# Patient Record
Sex: Male | Born: 1960
Health system: Southern US, Community
[De-identification: ages and names within clinical notes are randomized; demographics above are authoritative.]

## PROBLEM LIST (undated history)

## (undated) DIAGNOSIS — M1990 Unspecified osteoarthritis, unspecified site: Secondary | ICD-10-CM

## (undated) DIAGNOSIS — E785 Hyperlipidemia, unspecified: Secondary | ICD-10-CM

## (undated) DIAGNOSIS — I2510 Atherosclerotic heart disease of native coronary artery without angina pectoris: Secondary | ICD-10-CM

## (undated) DIAGNOSIS — T7840XA Allergy, unspecified, initial encounter: Secondary | ICD-10-CM

## (undated) HISTORY — DX: Allergy, unspecified, initial encounter: T78.40XA

## (undated) HISTORY — DX: Atherosclerotic heart disease of native coronary artery without angina pectoris: I25.10

## (undated) HISTORY — DX: Hyperlipidemia, unspecified: E78.5

## (undated) HISTORY — DX: Unspecified osteoarthritis, unspecified site: M19.90

## (undated) HISTORY — PX: CARDIAC CATHETERIZATION: SHX172

---

## 2008-12-13 HISTORY — PX: NECK SURGERY: SHX720

## 2014-05-14 ENCOUNTER — Encounter: Payer: Self-pay | Admitting: Internal Medicine

## 2014-05-15 ENCOUNTER — Encounter: Payer: Self-pay | Admitting: Internal Medicine

## 2014-07-16 ENCOUNTER — Ambulatory Visit (AMBULATORY_SURGERY_CENTER): Payer: Managed Care, Other (non HMO) | Admitting: *Deleted

## 2014-07-16 VITALS — Ht 71.0 in | Wt 234.2 lb

## 2014-07-16 DIAGNOSIS — Z1211 Encounter for screening for malignant neoplasm of colon: Secondary | ICD-10-CM

## 2014-07-16 MED ORDER — NA SULFATE-K SULFATE-MG SULF 17.5-3.13-1.6 GM/177ML PO SOLN
ORAL | Status: DC
Start: 2014-07-16 — End: 2014-07-30

## 2014-07-16 NOTE — Progress Notes (Signed)
No egg or soy allergy  Pt has had neck surgery but states he has no difficulty moving neck  No problems with anesthesia  No diet medications taken  Pt takes Hydrocodone BID; he states he has no difficulty moving his bowels and has a BM daily

## 2014-07-25 ENCOUNTER — Encounter: Payer: Self-pay | Admitting: Internal Medicine

## 2014-07-30 ENCOUNTER — Ambulatory Visit (AMBULATORY_SURGERY_CENTER): Payer: Managed Care, Other (non HMO) | Admitting: Internal Medicine

## 2014-07-30 ENCOUNTER — Encounter: Payer: Self-pay | Admitting: Internal Medicine

## 2014-07-30 VITALS — BP 129/66 | HR 48 | Temp 97.6°F | Resp 13 | Ht 71.0 in | Wt 234.0 lb

## 2014-07-30 DIAGNOSIS — Z1211 Encounter for screening for malignant neoplasm of colon: Secondary | ICD-10-CM

## 2014-07-30 MED ORDER — SODIUM CHLORIDE 0.9 % IV SOLN: 500.0000 mL | INTRAVENOUS | Status: DC

## 2014-07-30 NOTE — Patient Instructions (Addendum)
Your colonoscopy was normal.  Next routine colonoscopy in 10 years - 2025  I appreciate the opportunity to care for you. Carl E. Gessner, MD, FACG   YOU HAD AN ENDOSCOPIC PROCEDURE TODAY AT THE [facility]: Refer to the procedure report that was given to you for any specific questions about what was found during the examination.  If the procedure report does not answer your questions, please call your gastroenterologist to clarify.  If you requested that your care partner not be given the details of your procedure findings, then the procedure report has been included in a sealed envelope for you to review at your convenience later.  YOU SHOULD EXPECT: Some feelings of bloating in the abdomen. Passage of more gas than usual.  Walking can help get rid of the air that was put into your GI tract during the procedure and reduce the bloating. If you had a lower endoscopy (such as a colonoscopy or flexible sigmoidoscopy) you may notice spotting of blood in your stool or on the toilet paper. If you underwent a bowel prep for your procedure, then you may not have a normal bowel movement for a few days.  DIET: Your first meal following the procedure should be a light meal and then it is ok to progress to your normal diet.  A half-sandwich or bowl of soup is an example of a good first meal.  Heavy or fried foods are harder to digest and may make you feel nauseous or bloated.  Likewise meals heavy in dairy and vegetables can cause extra gas to form and this can also increase the bloating.  Drink plenty of fluids but you should avoid alcoholic beverages for 24 hours.  ACTIVITY: Your care partner should take you home directly after the procedure.  You should plan to take it easy, moving slowly for the rest of the day.  You can resume normal activity the day after the procedure however you should NOT DRIVE or use heavy machinery for 24 hours (because of the sedation medicines used during the test).     SYMPTOMS TO REPORT IMMEDIATELY: A gastroenterologist can be reached at any hour.  During normal business hours, 8:30 AM to 5:00 PM Monday through Friday, call [phone].  After hours and on weekends, please call the GI answering service at [phone] who will take a message and have the physician on call contact you.   Following lower endoscopy (colonoscopy or flexible sigmoidoscopy):  Excessive amounts of blood in the stool  Significant tenderness or worsening of abdominal pains  Swelling of the abdomen that is new, acute  Fever of 100F or higher  FOLLOW UP: If any biopsies were taken you will be contacted by phone or by letter within the next 1-3 weeks.  Call your gastroenterologist if you have not heard about the biopsies in 3 weeks.  Our staff will call the home number listed on your records the next business day following your procedure to check on you and address any questions or concerns that you may have at that time regarding the information given to you following your procedure. This is a courtesy call and so if there is no answer at the home number and we have not heard from you through the emergency physician on call, we will assume that you have returned to your regular daily activities without incident.  SIGNATURES/CONFIDENTIALITY: You and/or your care partner have signed paperwork which will be entered into your electronic medical record.  These   signatures attest to the fact that that the information above on your After Visit Summary has been reviewed and is understood.  Full responsibility of the confidentiality of this discharge information lies with you and/or your care-partner. 

## 2014-07-30 NOTE — Op Note (Signed)
[facility] [address] [place], [id number]   COLONOSCOPY PROCEDURE REPORT  PATIENT: RegisterDuffy Cameron, James Cameron  MR#: [id number] BIRTHDATE: [birth year] , 53  yrs. old GENDER: Male ENDOSCOPIST: Iva Booparl E Gessner, MD, [facility] PROCEDURE DATE:  07/30/2014 PROCEDURE:   Colonoscopy, screening First Screening Colonoscopy - Avg.  risk and is 50 yrs.  old or older Yes.  Prior Negative Screening - Now for repeat screening. N/A  History of Adenoma - Now for follow-up colonoscopy & has been > or = to 3 yrs.  N/A  Polyps Removed Today? No.  Recommend repeat exam, <10 yrs? No. ASA CLASS:   Class II INDICATIONS:average risk screening and first colonoscopy. MEDICATIONS: These medications were titrated to patient response per physician's verbal order, MAC sedation, administered by CRNA, and propofol (Diprivan) 200mg  IV  DESCRIPTION OF PROCEDURE:   After the risks benefits and alternatives of the procedure were thoroughly explained, informed consent was obtained.  A digital rectal exam revealed no abnormalities of the rectum, A digital rectal exam revealed no prostatic nodules, and A digital rectal exam revealed the prostate was not enlarged.   The LB [id number]  endoscope was introduced through the anus and advanced to the cecum, which was identified by both the appendix and ileocecal valve. No adverse events experienced.   The quality of the prep was excellent using Suprep  The instrument was then slowly withdrawn as the colon was fully examined.      COLON FINDINGS: A normal appearing cecum, ileocecal valve, and appendiceal orifice were identified.  The ascending, hepatic flexure, transverse, splenic flexure, descending, sigmoid colon and rectum appeared unremarkable.  No polyps or cancers were seen. Retroflexed views revealed no abnormalities. The time to cecum=1 minutes 21 seconds.  Withdrawal time=7 minutes 38 seconds.  The scope was withdrawn and the procedure  completed. COMPLICATIONS: There were no complications.  ENDOSCOPIC IMPRESSION: Normal colonoscopy - excellent prep - first screening  RECOMMENDATIONS: Repeat colonoscopy 10 years - 2025   eSigned:  Iva Booparl E Gessner, MD, [facility] 07/30/2014 11:50 AM   cc: The Patient  and Alysia PennaScott Holwerda, MD

## 2014-07-30 NOTE — Progress Notes (Signed)
A/ox3, pleased with MAC, report to RN 

## 2014-07-31 ENCOUNTER — Telehealth: Payer: Self-pay | Admitting: *Deleted

## 2014-07-31 NOTE — Telephone Encounter (Signed)
  Follow up Call-  Call back number 07/30/2014  Post procedure Call Back phone  # [phone]  Permission to leave phone message Yes     Patient questions:  Do you have a fever, pain , or abdominal swelling? No. Pain Score  0 *  Have you tolerated food without any problems? Yes.    Have you been able to return to your normal activities? Yes.    Do you have any questions about your discharge instructions: Diet   No. Medications  No. Follow up visit  No.  Do you have questions or concerns about your Care? No.  Actions: * If pain score is 4 or above: No action needed, pain <4.

## 2019-06-08 ENCOUNTER — Other Ambulatory Visit: Payer: Self-pay | Admitting: Internal Medicine

## 2019-06-08 DIAGNOSIS — E785 Hyperlipidemia, unspecified: Secondary | ICD-10-CM

## 2019-07-10 ENCOUNTER — Other Ambulatory Visit: Payer: Self-pay

## 2019-07-10 ENCOUNTER — Ambulatory Visit
Admission: RE | Admit: 2019-07-10 | Discharge: 2019-07-10 | Disposition: A | Discharge status: Home / Self Care | Payer: Self-pay | Source: Ambulatory Visit | Attending: Internal Medicine | Admitting: Internal Medicine

## 2019-07-10 DIAGNOSIS — E785 Hyperlipidemia, unspecified: Secondary | ICD-10-CM

## 2020-03-26 ENCOUNTER — Encounter (HOSPITAL_COMMUNITY): Payer: Self-pay

## 2020-03-26 ENCOUNTER — Other Ambulatory Visit: Payer: Self-pay

## 2020-03-26 ENCOUNTER — Inpatient Hospital Stay (HOSPITAL_COMMUNITY)
Admission: EM | Admit: 2020-03-26 | Discharge: 2020-03-31 | DRG: 246 | Disposition: A | Discharge status: Home / Self Care | Payer: 59 | Attending: Cardiovascular Disease | Admitting: Cardiovascular Disease

## 2020-03-26 ENCOUNTER — Emergency Department (HOSPITAL_COMMUNITY): Payer: 59

## 2020-03-26 DIAGNOSIS — I2119 ST elevation (STEMI) myocardial infarction involving other coronary artery of inferior wall: Principal | ICD-10-CM | POA: Diagnosis present

## 2020-03-26 DIAGNOSIS — D71 Functional disorders of polymorphonuclear neutrophils: Secondary | ICD-10-CM | POA: Diagnosis present

## 2020-03-26 DIAGNOSIS — Z87891 Personal history of nicotine dependence: Secondary | ICD-10-CM

## 2020-03-26 DIAGNOSIS — I200 Unstable angina: Secondary | ICD-10-CM | POA: Diagnosis present

## 2020-03-26 DIAGNOSIS — Z8379 Family history of other diseases of the digestive system: Secondary | ICD-10-CM

## 2020-03-26 DIAGNOSIS — Z955 Presence of coronary angioplasty implant and graft: Secondary | ICD-10-CM

## 2020-03-26 DIAGNOSIS — I10 Essential (primary) hypertension: Secondary | ICD-10-CM | POA: Diagnosis present

## 2020-03-26 DIAGNOSIS — Z8249 Family history of ischemic heart disease and other diseases of the circulatory system: Secondary | ICD-10-CM

## 2020-03-26 DIAGNOSIS — I700 Atherosclerosis of aorta: Secondary | ICD-10-CM | POA: Diagnosis present

## 2020-03-26 DIAGNOSIS — R001 Bradycardia, unspecified: Secondary | ICD-10-CM | POA: Diagnosis present

## 2020-03-26 DIAGNOSIS — I25110 Atherosclerotic heart disease of native coronary artery with unstable angina pectoris: Secondary | ICD-10-CM | POA: Diagnosis present

## 2020-03-26 DIAGNOSIS — M1990 Unspecified osteoarthritis, unspecified site: Secondary | ICD-10-CM | POA: Diagnosis present

## 2020-03-26 DIAGNOSIS — E785 Hyperlipidemia, unspecified: Secondary | ICD-10-CM | POA: Diagnosis present

## 2020-03-26 DIAGNOSIS — I214 Non-ST elevation (NSTEMI) myocardial infarction: Secondary | ICD-10-CM | POA: Diagnosis present

## 2020-03-26 DIAGNOSIS — E669 Obesity, unspecified: Secondary | ICD-10-CM | POA: Diagnosis present

## 2020-03-26 DIAGNOSIS — Z880 Allergy status to penicillin: Secondary | ICD-10-CM

## 2020-03-26 DIAGNOSIS — I472 Ventricular tachycardia: Secondary | ICD-10-CM | POA: Diagnosis not present

## 2020-03-26 DIAGNOSIS — Z6836 Body mass index (BMI) 36.0-36.9, adult: Secondary | ICD-10-CM

## 2020-03-26 DIAGNOSIS — S75001A Unspecified injury of femoral artery, right leg, initial encounter: Secondary | ICD-10-CM | POA: Diagnosis not present

## 2020-03-26 DIAGNOSIS — Z20822 Contact with and (suspected) exposure to covid-19: Secondary | ICD-10-CM | POA: Diagnosis present

## 2020-03-26 LAB — CBC
HCT: 44.1 % (ref 39.0–52.0)
Hemoglobin: 15.2 g/dL (ref 13.0–17.0)
MCH: 31.9 pg (ref 26.0–34.0)
MCHC: 34.5 g/dL (ref 30.0–36.0)
MCV: 92.5 fL (ref 80.0–100.0)
Platelets: 248 10*3/uL (ref 150–400)
RBC: 4.77 MIL/uL (ref 4.22–5.81)
RDW: 12.3 % (ref 11.5–15.5)
WBC: 9.7 10*3/uL (ref 4.0–10.5)
nRBC: 0 % (ref 0.0–0.2)

## 2020-03-26 LAB — COMPREHENSIVE METABOLIC PANEL
ALT: 22 U/L (ref 0–44)
AST: 28 U/L (ref 15–41)
Albumin: 4.1 g/dL (ref 3.5–5.0)
Alkaline Phosphatase: 79 U/L (ref 38–126)
Anion gap: 10 (ref 5–15)
BUN: 12 mg/dL (ref 6–20)
CO2: 27 mmol/L (ref 22–32)
Calcium: 9.4 mg/dL (ref 8.9–10.3)
Chloride: 102 mmol/L (ref 98–111)
Creatinine, Ser: 1.21 mg/dL (ref 0.61–1.24)
GFR calc Af Amer: >60 mL/min (ref >60)
GFR calc non Af Amer: >60 mL/min (ref >60)
Glucose, Bld: 104 mg/dL — ABNORMAL HIGH (ref 70–99)
Potassium: 3.7 mmol/L (ref 3.5–5.1)
Sodium: 139 mmol/L (ref 135–145)
Total Bilirubin: 0.9 mg/dL (ref 0.3–1.2)
Total Protein: 7.8 g/dL (ref 6.5–8.1)

## 2020-03-26 LAB — TROPONIN I (HIGH SENSITIVITY): Troponin I (High Sensitivity): 507 ng/L (ref <18)

## 2020-03-26 MED ORDER — ASPIRIN 81 MG PO CHEW
324.0000 mg | CHEWABLE_TABLET | Freq: Once | ORAL | Status: AC
  Administered 2020-03-26: 324 mg via ORAL
  Filled 2020-03-26: qty 4

## 2020-03-26 NOTE — ED Triage Notes (Signed)
Pt states that he has had [facility] for a while. Pt states that it got so bad he almost called EMS last night. Pt denies any leg swelling. No hx of COPD or asthma. Pt quit smoking 17 years ago.

## 2020-03-26 NOTE — ED Provider Notes (Signed)
[facility] Emergency Department Provider Note MRN:  [id number]  Arrival date & time: 03/27/20     Chief Complaint   Shortness of Breath   History of Present Illness   James Cameron is a 59 y.o. year-old male with a history of CAD presenting to the ED with chief complaint of shortness of breath.  Patient is endorsing dyspnea on exertion and chest pressure for the past 2 days.  Dr. Meredith Pel feels seems that whenever he tries to do anything active, even minimal ambulating he becomes very short of breath and experiences a chest pressure, occasionally with diaphoresis and nausea.  Feels generally fatigued and weak when standing or laying still, denies having any chest pain or shortness of breath when not exerting himself.  Denies any fever or cough, no abdominal pain, no numbness or weakness, no other complaints.  Review of Systems  A complete 10 system review of systems was obtained and all systems are negative except as noted in the HPI and PMH.   Patient's Health History    Past Medical History:  Diagnosis Date  . Allergy    hx sinus infections  . Arthritis     Past Surgical History:  Procedure Laterality Date  . NECK SURGERY  2010   3 plates, 12 screws    Family History  Problem Relation Age of Onset  . Crohn's disease Mother     Social History   Socioeconomic History  . Marital status: Married    Spouse name: Not on file  . Number of children: Not on file  . Years of education: Not on file  . Highest education level: Not on file  Occupational History  . Not on file  Tobacco Use  . Smoking status: Former Research scientist (life sciences)  . Smokeless tobacco: Never Used  Substance and Sexual Activity  . Alcohol use: Yes    Comment: very rarely  . Drug use: No  . Sexual activity: Not on file  Other Topics Concern  . Not on file  Social History Narrative  . Not on file   Social Determinants of Health   Financial Resource Strain:   . Difficulty of Paying Living  Expenses:   Food Insecurity:   . Worried About Charity fundraiser in the Last Year:   . Arboriculturist in the Last Year:   Transportation Needs:   . Film/video editor (Medical):   Marland Kitchen Lack of Transportation (Non-Medical):   Physical Activity:   . Days of Exercise per Week:   . Minutes of Exercise per Session:   Stress:   . Feeling of Stress :   Social Connections:   . Frequency of Communication with Friends and Family:   . Frequency of Social Gatherings with Friends and Family:   . Attends Religious Services:   . Active Member of Clubs or Organizations:   . Attends Archivist Meetings:   Marland Kitchen Marital Status:   Intimate Partner Violence:   . Fear of Current or Ex-Partner:   . Emotionally Abused:   Marland Kitchen Physically Abused:   . Sexually Abused:      Physical Exam   Vitals:   03/26/20 2218 03/27/20 0000  BP: (!) 148/99 131/78  Pulse: 65 (!) 57  Resp: 17 10  Temp:    SpO2: 100% 97%    CONSTITUTIONAL: Well-appearing, NAD NEURO:  Alert and oriented x 3, no focal deficits EYES:  eyes equal and reactive ENT/NECK:  no LAD, no JVD CARDIO: Regular  rate, well-perfused, normal S1 and S2 PULM:  CTAB no wheezing or rhonchi GI/GU:  normal bowel sounds, non-distended, non-tender MSK/SPINE:  No gross deformities, no edema SKIN:  no rash, atraumatic PSYCH:  Appropriate speech and behavior  *Additional and/or pertinent findings included in MDM below  Diagnostic and Interventional Summary    EKG Interpretation  Date/Time:  Wednesday March 26 2020 16:11:05 EDT Ventricular Rate:  69 PR Interval:  138 QRS Duration: 88 QT Interval:  400 QTC Calculation: 428 R Axis:   73 Text Interpretation: Normal sinus rhythm Normal ECG No previous ECGs available Confirmed by Kennis Carina [phone]) on 03/26/2020 10:48:08 PM      Labs Reviewed  COMPREHENSIVE METABOLIC PANEL - Abnormal; Notable for the following components:      Result Value   Glucose, Bld 104 (*)    All other  components within normal limits  TROPONIN I (HIGH SENSITIVITY) - Abnormal; Notable for the following components:   Troponin I (High Sensitivity) 507 (*)    All other components within normal limits  RESPIRATORY PANEL BY RT PCR (FLU A&B, COVID)  CBC  BRAIN NATRIURETIC PEPTIDE  APTT  PROTIME-INR  TROPONIN I (HIGH SENSITIVITY)    DG Chest 2 View  Final Result      Medications  oxyCODONE (Oxy IR/ROXICODONE) immediate release tablet 5 mg (has no administration in time range)  nitroGLYCERIN (NITROSTAT) SL tablet 0.4 mg (has no administration in time range)  aspirin chewable tablet 324 mg (324 mg Oral Given 03/26/20 2301)     Procedures  /  Critical Care .Critical Care Performed by: Sabas Sous, MD Authorized by: Sabas Sous, MD   Critical care provider statement:    Critical care time (minutes):  45   Critical care was necessary to treat or prevent imminent or life-threatening deterioration of the following conditions: Acute coronary syndrome, non-ST segment elevation myocardial infarction.   Critical care was time spent personally by me on the following activities:  Discussions with consultants, evaluation of patient's response to treatment, examination of patient, ordering and performing treatments and interventions, ordering and review of laboratory studies, ordering and review of radiographic studies, pulse oximetry, re-evaluation of patient's condition, obtaining history from patient or surrogate and review of old charts    ED Course and Medical Decision Making  I have reviewed the triage vital signs, the nursing notes, and pertinent available records from the EMR.  Listed above are laboratory and imaging tests that I personally ordered, reviewed, and interpreted and then considered in my medical decision making (see below for details).      Highly consistent with angina and cardiac chest pain, however currently not providing any signs or symptoms of unstable angina.   EKG is without ischemic findings, awaiting troponin, chest x-ray.  No evidence of DVT, doubt PE.  Strong family history of CAD, also has a coronary CT demonstrating evidence of CAD few years ago.  With 2 negative troponins and continued lack of any pain at rest, I would consider very close cardiology follow-up as an outpatient however I would also offer admission given my concern for cardiac chest pain.  12:20 AM update: First troponin returns at greater than 500.  Patient continues to look well, he is now admitting to having intermittent chest pressure while at rest.  This raises concern for ACS, namely NSTEMI.  Discussed case with Dr. Electa Sniff of cardiology, who recommends heparin drip and admission to the cardiology service at [facility].  Elmer Sow. Pilar Plate, MD [facility] [email or web address]  Final Clinical Impressions(s) / ED Diagnoses     ICD-10-CM   1. NSTEMI (non-ST elevated myocardial infarction) [facility])  I21.4     ED Discharge Orders    None       Discharge Instructions Discussed with and Provided to Patient:   Discharge Instructions   None       Sabas Sous, MD 03/27/20 0025

## 2020-03-27 ENCOUNTER — Inpatient Hospital Stay (HOSPITAL_COMMUNITY)
Admission: EM | Disposition: A | Discharge status: Home / Self Care | Payer: Self-pay | Source: Home / Self Care | Attending: Cardiovascular Disease

## 2020-03-27 ENCOUNTER — Other Ambulatory Visit (HOSPITAL_COMMUNITY): Payer: 59

## 2020-03-27 ENCOUNTER — Encounter (HOSPITAL_COMMUNITY): Payer: Self-pay | Admitting: Cardiovascular Disease

## 2020-03-27 ENCOUNTER — Other Ambulatory Visit: Payer: Self-pay

## 2020-03-27 DIAGNOSIS — I361 Nonrheumatic tricuspid (valve) insufficiency: Secondary | ICD-10-CM | POA: Diagnosis not present

## 2020-03-27 DIAGNOSIS — I2119 ST elevation (STEMI) myocardial infarction involving other coronary artery of inferior wall: Secondary | ICD-10-CM | POA: Diagnosis present

## 2020-03-27 DIAGNOSIS — I724 Aneurysm of artery of lower extremity: Secondary | ICD-10-CM | POA: Diagnosis not present

## 2020-03-27 DIAGNOSIS — R001 Bradycardia, unspecified: Secondary | ICD-10-CM | POA: Diagnosis present

## 2020-03-27 DIAGNOSIS — I472 Ventricular tachycardia: Secondary | ICD-10-CM | POA: Diagnosis not present

## 2020-03-27 DIAGNOSIS — Z8379 Family history of other diseases of the digestive system: Secondary | ICD-10-CM | POA: Diagnosis not present

## 2020-03-27 DIAGNOSIS — I25110 Atherosclerotic heart disease of native coronary artery with unstable angina pectoris: Secondary | ICD-10-CM | POA: Diagnosis present

## 2020-03-27 DIAGNOSIS — Z20822 Contact with and (suspected) exposure to covid-19: Secondary | ICD-10-CM | POA: Diagnosis present

## 2020-03-27 DIAGNOSIS — Z880 Allergy status to penicillin: Secondary | ICD-10-CM | POA: Diagnosis not present

## 2020-03-27 DIAGNOSIS — I2121 ST elevation (STEMI) myocardial infarction involving left circumflex coronary artery: Secondary | ICD-10-CM

## 2020-03-27 DIAGNOSIS — I2510 Atherosclerotic heart disease of native coronary artery without angina pectoris: Secondary | ICD-10-CM | POA: Diagnosis not present

## 2020-03-27 DIAGNOSIS — M1990 Unspecified osteoarthritis, unspecified site: Secondary | ICD-10-CM | POA: Diagnosis present

## 2020-03-27 DIAGNOSIS — I10 Essential (primary) hypertension: Secondary | ICD-10-CM | POA: Diagnosis present

## 2020-03-27 DIAGNOSIS — Z8249 Family history of ischemic heart disease and other diseases of the circulatory system: Secondary | ICD-10-CM | POA: Diagnosis not present

## 2020-03-27 DIAGNOSIS — I214 Non-ST elevation (NSTEMI) myocardial infarction: Secondary | ICD-10-CM | POA: Diagnosis present

## 2020-03-27 DIAGNOSIS — I700 Atherosclerosis of aorta: Secondary | ICD-10-CM | POA: Diagnosis present

## 2020-03-27 DIAGNOSIS — D71 Functional disorders of polymorphonuclear neutrophils: Secondary | ICD-10-CM | POA: Diagnosis present

## 2020-03-27 DIAGNOSIS — Z6836 Body mass index (BMI) 36.0-36.9, adult: Secondary | ICD-10-CM | POA: Diagnosis not present

## 2020-03-27 DIAGNOSIS — I200 Unstable angina: Secondary | ICD-10-CM | POA: Diagnosis present

## 2020-03-27 DIAGNOSIS — E785 Hyperlipidemia, unspecified: Secondary | ICD-10-CM | POA: Diagnosis present

## 2020-03-27 DIAGNOSIS — S75001A Unspecified injury of femoral artery, right leg, initial encounter: Secondary | ICD-10-CM | POA: Diagnosis not present

## 2020-03-27 DIAGNOSIS — Z87891 Personal history of nicotine dependence: Secondary | ICD-10-CM | POA: Diagnosis not present

## 2020-03-27 DIAGNOSIS — E669 Obesity, unspecified: Secondary | ICD-10-CM | POA: Diagnosis present

## 2020-03-27 HISTORY — PX: LEFT HEART CATH AND CORONARY ANGIOGRAPHY: CATH118249

## 2020-03-27 HISTORY — PX: CORONARY/GRAFT ACUTE MI REVASCULARIZATION: CATH118305

## 2020-03-27 LAB — CBC
HCT: 42.3 % (ref 39.0–52.0)
HCT: 44.9 % (ref 39.0–52.0)
Hemoglobin: 14.8 g/dL (ref 13.0–17.0)
Hemoglobin: 16.3 g/dL (ref 13.0–17.0)
MCH: 32.5 pg (ref 26.0–34.0)
MCH: 32.1 pg (ref 26.0–34.0)
MCHC: 35 g/dL (ref 30.0–36.0)
MCHC: 36.3 g/dL — ABNORMAL HIGH (ref 30.0–36.0)
MCV: 92.8 fL (ref 80.0–100.0)
MCV: 88.6 fL (ref 80.0–100.0)
Platelets: 233 10*3/uL (ref 150–400)
Platelets: 292 10*3/uL (ref 150–400)
RBC: 4.56 MIL/uL (ref 4.22–5.81)
RBC: 5.07 MIL/uL (ref 4.22–5.81)
RDW: 12.4 % (ref 11.5–15.5)
RDW: 12.1 % (ref 11.5–15.5)
WBC: 7.8 10*3/uL (ref 4.0–10.5)
WBC: 14.9 10*3/uL — ABNORMAL HIGH (ref 4.0–10.5)
nRBC: 0 % (ref 0.0–0.2)
nRBC: 0 % (ref 0.0–0.2)

## 2020-03-27 LAB — LIPID PANEL
Cholesterol: 137 mg/dL (ref 0–200)
HDL: 31 mg/dL — ABNORMAL LOW (ref >40)
LDL Cholesterol: 83 mg/dL (ref 0–99)
Total CHOL/HDL Ratio: 4.4 RATIO
Triglycerides: 113 mg/dL (ref <150)
VLDL: 23 mg/dL (ref 0–40)

## 2020-03-27 LAB — POCT ACTIVATED CLOTTING TIME
Activated Clotting Time: 307 seconds
Activated Clotting Time: 411 seconds
Activated Clotting Time: 169 seconds

## 2020-03-27 LAB — MRSA PCR SCREENING: MRSA by PCR: NEGATIVE

## 2020-03-27 LAB — HIV ANTIBODY (ROUTINE TESTING W REFLEX): HIV Screen 4th Generation wRfx: NONREACTIVE

## 2020-03-27 LAB — CREATININE, SERUM
Creatinine, Ser: 1.27 mg/dL — ABNORMAL HIGH (ref 0.61–1.24)
GFR calc Af Amer: >60 mL/min (ref >60)
GFR calc non Af Amer: >60 mL/min (ref >60)

## 2020-03-27 LAB — RESPIRATORY PANEL BY RT PCR (FLU A&B, COVID)
Influenza A by PCR: NEGATIVE
Influenza B by PCR: NEGATIVE
SARS Coronavirus 2 by RT PCR: NEGATIVE

## 2020-03-27 LAB — APTT: aPTT: 32 seconds (ref 24–36)

## 2020-03-27 LAB — HEPARIN LEVEL (UNFRACTIONATED): Heparin Unfractionated: 0.3 IU/mL (ref 0.30–0.70)

## 2020-03-27 LAB — HEMOGLOBIN A1C
Hgb A1c MFr Bld: 5.9 % — ABNORMAL HIGH (ref 4.8–5.6)
Mean Plasma Glucose: 122.63 mg/dL

## 2020-03-27 LAB — PROTIME-INR
INR: 1 (ref 0.8–1.2)
Prothrombin Time: 13.3 seconds (ref 11.4–15.2)

## 2020-03-27 LAB — BRAIN NATRIURETIC PEPTIDE: B Natriuretic Peptide: 42 pg/mL (ref 0.0–100.0)

## 2020-03-27 LAB — TROPONIN I (HIGH SENSITIVITY): Troponin I (High Sensitivity): 513 ng/L (ref <18)

## 2020-03-27 SURGERY — LEFT HEART CATH AND CORONARY ANGIOGRAPHY
Anesthesia: LOCAL

## 2020-03-27 MED ORDER — TICAGRELOR 90 MG PO TABS
ORAL_TABLET | ORAL | Status: AC
  Filled 2020-03-27: qty 2

## 2020-03-27 MED ORDER — NITROGLYCERIN 1 MG/10 ML FOR IR/CATH LAB
INTRA_ARTERIAL | Status: AC
  Filled 2020-03-27: qty 10

## 2020-03-27 MED ORDER — IOHEXOL 350 MG/ML SOLN
INTRAVENOUS | Status: AC
  Filled 2020-03-27: qty 1

## 2020-03-27 MED ORDER — LABETALOL HCL 5 MG/ML IV SOLN
INTRAVENOUS | Status: AC
  Filled 2020-03-27: qty 4

## 2020-03-27 MED ORDER — SODIUM CHLORIDE 0.9 % IV SOLN
4.0000 ug/kg/min | INTRAVENOUS | Status: AC
  Filled 2020-03-27: qty 50

## 2020-03-27 MED ORDER — HEPARIN SODIUM (PORCINE) 1000 UNIT/ML IJ SOLN
INTRAMUSCULAR | Status: DC | PRN
  Administered 2020-03-27: 10000 [IU] via INTRAVENOUS

## 2020-03-27 MED ORDER — OXYCODONE HCL 5 MG PO TABS
5.0000 mg | ORAL_TABLET | Freq: Once | ORAL | Status: AC
  Administered 2020-03-27: 5 mg via ORAL
  Filled 2020-03-27: qty 1

## 2020-03-27 MED ORDER — FENTANYL CITRATE (PF) 100 MCG/2ML IJ SOLN
INTRAMUSCULAR | Status: DC | PRN
  Administered 2020-03-27 (×3): 25 ug via INTRAVENOUS

## 2020-03-27 MED ORDER — MIDAZOLAM HCL 2 MG/2ML IJ SOLN
INTRAMUSCULAR | Status: DC | PRN
  Administered 2020-03-27 (×2): 2 mg via INTRAVENOUS

## 2020-03-27 MED ORDER — FUROSEMIDE 10 MG/ML IJ SOLN
INTRAMUSCULAR | Status: DC | PRN
  Administered 2020-03-27: 40 mg via INTRAVENOUS

## 2020-03-27 MED ORDER — LABETALOL HCL 5 MG/ML IV SOLN: 10.0000 mg | INTRAVENOUS | Status: AC | PRN

## 2020-03-27 MED ORDER — ROSUVASTATIN CALCIUM 20 MG PO TABS
40.0000 mg | ORAL_TABLET | Freq: Every day | ORAL | Status: DC
  Administered 2020-03-27 – 2020-03-31 (×5): 40 mg via ORAL
  Filled 2020-03-27 (×5): qty 2

## 2020-03-27 MED ORDER — ENOXAPARIN SODIUM 40 MG/0.4ML ~~LOC~~ SOLN
40.0000 mg | SUBCUTANEOUS | Status: DC
  Administered 2020-03-28 – 2020-03-31 (×4): 40 mg via SUBCUTANEOUS
  Filled 2020-03-27 (×4): qty 0.4

## 2020-03-27 MED ORDER — CANGRELOR BOLUS VIA INFUSION
INTRAVENOUS | Status: DC | PRN
  Administered 2020-03-27: 3606 ug via INTRAVENOUS

## 2020-03-27 MED ORDER — MIDAZOLAM HCL 2 MG/2ML IJ SOLN
INTRAMUSCULAR | Status: AC
  Filled 2020-03-27: qty 2

## 2020-03-27 MED ORDER — FENTANYL CITRATE (PF) 100 MCG/2ML IJ SOLN
INTRAMUSCULAR | Status: AC
  Filled 2020-03-27: qty 2

## 2020-03-27 MED ORDER — MORPHINE SULFATE (PF) 2 MG/ML IV SOLN
2.0000 mg | INTRAVENOUS | Status: DC | PRN
  Administered 2020-03-27 – 2020-03-28 (×4): 2 mg via INTRAVENOUS
  Filled 2020-03-27 (×4): qty 1

## 2020-03-27 MED ORDER — SODIUM CHLORIDE 0.9 % IV SOLN: INTRAVENOUS | Status: DC

## 2020-03-27 MED ORDER — TICAGRELOR 90 MG PO TABS
ORAL_TABLET | ORAL | Status: DC | PRN
  Administered 2020-03-27: 180 mg via ORAL

## 2020-03-27 MED ORDER — SODIUM CHLORIDE 0.9 % IV SOLN: 250.0000 mL | INTRAVENOUS | Status: DC | PRN

## 2020-03-27 MED ORDER — SODIUM CHLORIDE 0.9% FLUSH: 3.0000 mL | INTRAVENOUS | Status: DC | PRN

## 2020-03-27 MED ORDER — SODIUM CHLORIDE 0.9 % IV SOLN: INTRAVENOUS | Status: AC

## 2020-03-27 MED ORDER — FUROSEMIDE 10 MG/ML IJ SOLN
INTRAMUSCULAR | Status: AC
  Filled 2020-03-27: qty 4

## 2020-03-27 MED ORDER — ASPIRIN EC 81 MG PO TBEC
81.0000 mg | DELAYED_RELEASE_TABLET | Freq: Every day | ORAL | Status: DC
  Administered 2020-03-28 – 2020-03-31 (×4): 81 mg via ORAL
  Filled 2020-03-27 (×4): qty 1

## 2020-03-27 MED ORDER — LABETALOL HCL 5 MG/ML IV SOLN
INTRAVENOUS | Status: DC | PRN
  Administered 2020-03-27: 10 mg via INTRAVENOUS

## 2020-03-27 MED ORDER — NITROGLYCERIN 0.4 MG/SPRAY TL SOLN
Status: AC
  Filled 2020-03-27: qty 4.9

## 2020-03-27 MED ORDER — HEPARIN (PORCINE) IN NACL 1000-0.9 UT/500ML-% IV SOLN
INTRAVENOUS | Status: DC | PRN
  Administered 2020-03-27 (×2): 500 mL

## 2020-03-27 MED ORDER — NITROGLYCERIN 0.4 MG SL SUBL
0.4000 mg | SUBLINGUAL_TABLET | SUBLINGUAL | Status: DC | PRN
  Administered 2020-03-27 (×4): 0.4 mg via SUBLINGUAL
  Filled 2020-03-27 (×2): qty 1

## 2020-03-27 MED ORDER — ASPIRIN 81 MG PO CHEW: 81.0000 mg | CHEWABLE_TABLET | ORAL | Status: DC

## 2020-03-27 MED ORDER — HEPARIN (PORCINE) 25000 UT/250ML-% IV SOLN
1300.0000 [IU]/h | INTRAVENOUS | Status: DC
  Administered 2020-03-27: 1200 [IU]/h via INTRAVENOUS
  Filled 2020-03-27: qty 250

## 2020-03-27 MED ORDER — NITROGLYCERIN 1 MG/10 ML FOR IR/CATH LAB
INTRA_ARTERIAL | Status: DC | PRN
  Administered 2020-03-27: 200 ug

## 2020-03-27 MED ORDER — SODIUM CHLORIDE 0.9 % IV SOLN
INTRAVENOUS | Status: AC | PRN
  Administered 2020-03-27: 4 ug/kg/min via INTRAVENOUS

## 2020-03-27 MED ORDER — METOPROLOL TARTRATE 12.5 MG HALF TABLET
12.5000 mg | ORAL_TABLET | Freq: Two times a day (BID) | ORAL | Status: DC
  Administered 2020-03-27 – 2020-03-31 (×9): 12.5 mg via ORAL
  Filled 2020-03-27 (×9): qty 1

## 2020-03-27 MED ORDER — ONDANSETRON HCL 4 MG/2ML IJ SOLN: 4.0000 mg | Freq: Four times a day (QID) | INTRAMUSCULAR | Status: DC | PRN

## 2020-03-27 MED ORDER — TICAGRELOR 90 MG PO TABS
90.0000 mg | ORAL_TABLET | Freq: Two times a day (BID) | ORAL | Status: DC
  Administered 2020-03-27 – 2020-03-31 (×8): 90 mg via ORAL
  Filled 2020-03-27 (×8): qty 1

## 2020-03-27 MED ORDER — ATROPINE SULFATE 1 MG/10ML IJ SOSY
PREFILLED_SYRINGE | INTRAMUSCULAR | Status: AC
  Filled 2020-03-27: qty 10

## 2020-03-27 MED ORDER — HYDRALAZINE HCL 20 MG/ML IJ SOLN
10.0000 mg | INTRAMUSCULAR | Status: AC | PRN
  Administered 2020-03-27 (×2): 10 mg via INTRAVENOUS
  Filled 2020-03-27: qty 1

## 2020-03-27 MED ORDER — HEPARIN BOLUS VIA INFUSION
4000.0000 [IU] | Freq: Once | INTRAVENOUS | Status: AC
  Administered 2020-03-27: 4000 [IU] via INTRAVENOUS
  Filled 2020-03-27: qty 4000

## 2020-03-27 MED ORDER — CHLORHEXIDINE GLUCONATE CLOTH 2 % EX PADS
6.0000 | MEDICATED_PAD | Freq: Every day | CUTANEOUS | Status: DC
  Administered 2020-03-27 – 2020-03-31 (×4): 6 via TOPICAL

## 2020-03-27 MED ORDER — LIDOCAINE HCL (PF) 1 % IJ SOLN
INTRAMUSCULAR | Status: DC | PRN
  Administered 2020-03-27: 2 mL

## 2020-03-27 MED ORDER — IOHEXOL 350 MG/ML SOLN
INTRAVENOUS | Status: DC | PRN
  Administered 2020-03-27: 190 mL

## 2020-03-27 MED ORDER — NITROGLYCERIN 0.4 MG SL SUBL: 0.4000 mg | SUBLINGUAL_TABLET | SUBLINGUAL | Status: DC | PRN

## 2020-03-27 MED ORDER — LIDOCAINE HCL (PF) 1 % IJ SOLN
INTRAMUSCULAR | Status: AC
  Filled 2020-03-27: qty 30

## 2020-03-27 MED ORDER — ACETAMINOPHEN 325 MG PO TABS
650.0000 mg | ORAL_TABLET | ORAL | Status: DC | PRN
  Administered 2020-03-27: 650 mg via ORAL
  Filled 2020-03-27: qty 2

## 2020-03-27 MED ORDER — HEPARIN (PORCINE) IN NACL 1000-0.9 UT/500ML-% IV SOLN
INTRAVENOUS | Status: AC
  Filled 2020-03-27: qty 1000

## 2020-03-27 MED ORDER — SODIUM CHLORIDE 0.9% FLUSH
3.0000 mL | Freq: Two times a day (BID) | INTRAVENOUS | Status: DC
  Administered 2020-03-27 – 2020-03-31 (×7): 3 mL via INTRAVENOUS

## 2020-03-27 MED ORDER — HEPARIN SODIUM (PORCINE) 1000 UNIT/ML IJ SOLN
INTRAMUSCULAR | Status: AC
  Filled 2020-03-27: qty 1

## 2020-03-27 MED ORDER — CANGRELOR TETRASODIUM 50 MG IV SOLR
INTRAVENOUS | Status: AC
  Filled 2020-03-27: qty 50

## 2020-03-27 MED ORDER — VERAPAMIL HCL 2.5 MG/ML IV SOLN
INTRAVENOUS | Status: AC
  Filled 2020-03-27: qty 2

## 2020-03-27 SURGICAL SUPPLY — 25 items
BALLN SAPPHIRE 2.5X12 (BALLOONS) ×2
BALLN SAPPHIRE ~~LOC~~ 4.0X18 (BALLOONS) ×1 IMPLANT
BALLOON SAPPHIRE 2.5X12 (BALLOONS) IMPLANT
CATH INFINITI 5FR MULTPACK ANG (CATHETERS) ×1 IMPLANT
CATH OPTITORQUE TIG 4.0 5F (CATHETERS) ×1 IMPLANT
CATH VISTA GUIDE 6FR JR4 (CATHETERS) ×1 IMPLANT
CATH VISTA GUIDE 6FR XB3.5 (CATHETERS) ×1 IMPLANT
ELECT DEFIB PAD ADLT CADENCE (PAD) ×1 IMPLANT
GLIDESHEATH SLEND SS 6F .021 (SHEATH) ×1 IMPLANT
GUIDEWIRE INQWIRE 1.5J.035X260 (WIRE) IMPLANT
INQWIRE 1.5J .035X260CM (WIRE) ×2
KIT ENCORE 26 ADVANTAGE (KITS) ×1 IMPLANT
KIT HEART LEFT (KITS) ×2 IMPLANT
PACK CARDIAC CATHETERIZATION (CUSTOM PROCEDURE TRAY) ×2 IMPLANT
SHEATH PINNACLE 6F 10CM (SHEATH) ×1 IMPLANT
SHEATH PINNACLE 7F 10CM (SHEATH) ×1 IMPLANT
SHEATH PROBE COVER 6X72 (BAG) ×1 IMPLANT
STENT SYNERGY XD 2.50X16 (Permanent Stent) IMPLANT
STENT SYNERGY XD 3.50X24 (Permanent Stent) IMPLANT
SYNERGY XD 2.50X16 (Permanent Stent) ×2 IMPLANT
SYNERGY XD 3.50X24 (Permanent Stent) ×2 IMPLANT
TRANSDUCER W/STOPCOCK (MISCELLANEOUS) ×2 IMPLANT
TUBING CIL FLEX 10 FLL-RA (TUBING) ×2 IMPLANT
WIRE ASAHI PROWATER 180CM (WIRE) ×1 IMPLANT
WIRE EMERALD 3MM-J .035X150CM (WIRE) ×1 IMPLANT

## 2020-03-27 NOTE — Progress Notes (Signed)
R femoral sheath removed by this RN with the assistance of Tiara, RN. Pt educated about procedure, possible complications and after-removal care. Atropine was pulled in the event of a vagal response, but was not used. 10 cc of blood was removed from sheath prior to removal. Tape was removed, sutures were cut, and firm pressure was held over the femoral artery (start time: 2233) for 10 minutes. Some blood oozed out at beginning of procedure, but hemostasis was achieved after 22 minutes of gradually reducing pressure at site (end time: 2255). Small level 1 hematoma noted slightly above sheath exit (marked under dressing) and pressure dressing was applied. Pt had no complications and tolerated removal well. RN will continue to monitor pt and site closely.

## 2020-03-27 NOTE — Progress Notes (Signed)
ANTICOAGULATION CONSULT NOTE - Initial Consult  Pharmacy Consult for IV heparin Indication: chest pain/ACS  Allergies  Allergen Reactions  . Penicillins Rash    Patient Measurements: Weight: 122.5 kg (270 lb) Heparin Dosing Weight: 89 kg  Vital Signs: Temp: 99 F (37.2 C) (04/14 1928) Temp Source: Oral (04/14 1928) BP: 148/99 (04/14 2218) Pulse Rate: 65 (04/14 2218)  Labs: Recent Labs    03/26/20 2250  HGB 15.2  HCT 44.1  PLT 248  CREATININE 1.21  TROPONINIHS 507*    CrCl cannot be calculated (Unknown ideal weight.).   Medical History: Past Medical History:  Diagnosis Date  . Allergy    hx sinus infections  . Arthritis     Medications:  Scheduled:  . oxyCODONE  5 mg Oral Once   Infusions:    Assessment: 42 yoM with SOB now with elevated troponin = 507.  Baseline labs: H/H = 15.2/44.1, Plts = 248, aptt =32, INR = 1  Goal of Therapy:  Heparin level 0.3-0.7 units/ml Monitor platelets by anticoagulation protocol: Yes   Plan:  Baseline aptt/INR and ht STAT Heparin 4000  Unit IV bolus x1 now Start heparin drip at 1200 units/hr Daily CBC/HL Check 1st HL in 6 hours  Lorenza Evangelist 03/27/2020,12:16 AM

## 2020-03-27 NOTE — Progress Notes (Signed)
ANTICOAGULATION CONSULT NOTE  Pharmacy Consult for IV heparin Indication: chest pain/ACS  Allergies  Allergen Reactions  . Penicillins Rash    Patient Measurements: Height: 5\' 11"  (180.3 cm) Weight: 120.2 kg (265 lb) IBW/kg (Calculated) : 75.3 Heparin Dosing Weight: 89 kg  Vital Signs: BP: 137/82 (04/15 0900) Pulse Rate: 59 (04/15 0951)  Labs: Recent Labs    03/26/20 2250 03/27/20 0015 03/27/20 0056 03/27/20 0800  HGB 15.2  --   --  14.8  HCT 44.1  --   --  42.3  PLT 248  --   --  233  APTT  --  32  --   --   LABPROT  --  13.3  --   --   INR  --  1.0  --   --   HEPARINUNFRC  --   --   --  0.30  CREATININE 1.21  --   --   --   TROPONINIHS 507*  --  513*  --     Estimated Creatinine Clearance: 88.9 mL/min (by C-G formula based on SCr of 1.21 mg/dL).   Medical History: Past Medical History:  Diagnosis Date  . Allergy    hx sinus infections  . Arthritis     Medications:  Scheduled:  . rosuvastatin  40 mg Oral Daily   Infusions:  . sodium chloride 50 mL/hr at 03/27/20 0954  . heparin 1,200 Units/hr (03/27/20 0954)    Assessment: 24 yoM with PMH CAD, presenting with DOE and chest pressure x 2 days. Trop found to be elevated although EKG WNL. Pharmacy consulted to dose heparin infusion for possible NSTEMI.   Baseline INR, aPTT: WNL  Prior anticoagulation: none  Significant events:  Today, 03/27/2020:  CBC: stable WNL  Most recent heparin level therapeutic but borderline low on 1200 units/hr  No bleeding or infusion issues per nursing  Planning for [facility] cath  Goal of Therapy: Heparin level 0.3-0.7 units/ml Monitor platelets by anticoagulation protocol: Yes  Plan:  Increase heparin IV infusion to 1300 units/hr; no bolus  Check heparin level 6 hrs after rate change  Daily CBC, daily heparin level once stable  Monitor for signs of bleeding or thrombosis  F/u LHC results and anticoag plans post-cath  [facility], PharmD,  BCPS [phone] 03/27/2020, 10:09 AM

## 2020-03-27 NOTE — H&P (Signed)
Cardiology History and Physical:   Patient ID: [employer] MRN: [id number]; DOB: December 23, 1960  Admit date: 03/26/2020 Date of Consult: 03/27/2020  Primary Care Provider: Velna Hatchet, MD Primary Cardiologist: Evalina Field, MD new Primary Electrophysiologist:  None    Patient Profile:   James Cameron is a 59 y.o. male with a hx of HTN, HLD, obesity, and past smoking who is being seen today for the evaluation of chest pain and DOE at the request of Dr. Charissa Bash.  History of Present Illness:   James Cameron does not currently follow with cardiology. He started noticing DOE last year. PCP obtained a calcium score 06/2019 which was 343 placing him at the 92nd percentile. He was also noted to have aortic atherosclerosis. He states no additional follow up was planned. He has not seen pulmonology for his DOE. He reports intermittent chest discomfort since last year. On Sunday night, he suffered more severe right sided chest pain with radiation to his back and down his right arm. He was able to sit up with some pain relief. Pain felt like something sitting on his chest and was rated as a9/10. Since then, he has continued to have intermittent chest pressure with exertion. He denies chest pain at rest and denies CP since being in the ER. CP has been associated with diaphoresis and SOB. No palpitations, lower extremity edema, orthopnea, or syncope. He is on losartan for HTN and low dose crestor. He is not diabetic. He is obese and does not exercise regularly.   He is married and has been taking care of his ill mother. He has a strong family history of heart disease on his father's side. Father died of MI. His older brother had PCI in his mid-50s. 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 He is a former smoker.    Past Medical History:  Diagnosis Date  . Allergy    hx sinus infections  . Arthritis     Past Surgical History:  Procedure Laterality Date  .  NECK SURGERY  2010   3 plates, 12 screws     Home Medications:  Prior to Admission medications   Medication Sig Start Date End Date Taking? Authorizing Provider  ibuprofen (ADVIL,MOTRIN) 800 MG tablet Take 800 mg by mouth 2 (two) times daily.   Yes [provider]  rosuvastatin (CRESTOR) 5 MG tablet Take 5 mg by mouth at bedtime. 01/04/20  Yes [provider]    Inpatient Medications: Scheduled Meds:  Continuous Infusions: . sodium chloride    . heparin 1,200 Units/hr (03/27/20 0130)   PRN Meds: nitroGLYCERIN  Allergies:    Allergies  Allergen Reactions  . Penicillins Rash    Social History:   Social History   Socioeconomic History  . Marital status: Married    Spouse name: Not on file  . Number of children: Not on file  . Years of education: Not on file  . Highest education level: Not on file  Occupational History  . Not on file  Tobacco Use  . Smoking status: Former Research scientist (life sciences)  . Smokeless tobacco: Never Used  Substance and Sexual Activity  . Alcohol use: Yes    Comment: very rarely  . Drug use: No  . Sexual activity: Not on file  Other Topics Concern  . Not on file  Social History Narrative  . Not on file   Social Determinants of Health   Financial Resource Strain:   . Difficulty  of Paying Living Expenses:   Food Insecurity:   . Worried About Programme researcher, broadcasting/film/video in the Last Year:   . Barista in the Last Year:   Transportation Needs:   . Freight forwarder (Medical):   Marland Kitchen Lack of Transportation (Non-Medical):   Physical Activity:   . Days of Exercise per Week:   . Minutes of Exercise per Session:   Stress:   . Feeling of Stress :   Social Connections:   . Frequency of Communication with Friends and Family:   . Frequency of Social Gatherings with Friends and Family:   . Attends Religious Services:   . Active Member of Clubs or Organizations:   . Attends Banker Meetings:   Marland Kitchen Marital Status:   Intimate  Partner Violence:   . Fear of Current or Ex-Partner:   . Emotionally Abused:   Marland Kitchen Physically Abused:   . Sexually Abused:     Family History:    Family History  Problem Relation Age of Onset  . Crohn's disease Mother   . Heart attack Father   . Heart disease Brother      ROS:  Please see the history of present illness.   All other ROS reviewed and negative.     Physical Exam/Data:   Vitals:   03/27/20 0630 03/27/20 0700 03/27/20 0730 03/27/20 0815  BP: 139/85 (!) 142/101 137/77   Pulse: (!) 53 (!) 53 (!) 51 (!) 54  Resp: 13 13 11 15   Temp:      TempSrc:      SpO2: 98% 98% 97% 96%  Weight:      Height:       No intake or output data in the 24 hours ending 03/27/20 0854 Last 3 Weights 03/27/2020 03/26/2020 07/30/2014  Weight (lbs) 265 lb 270 lb 234 lb  Weight (kg) 120.203 kg 122.471 kg 106.142 kg     Body mass index is 36.96 kg/m.  General:  Obese male in no acute distress HEENT: normal Lymph: no adenopathy Neck: no JVD Endocrine:  No thryomegaly Vascular: No carotid bruits Cardiac:  normal S1, S2; RRR; no murmur  Lungs:  clear to auscultation bilaterally, no wheezing, rhonchi or rales  Abd: soft, nontender, no hepatomegaly  Ext: no edema Musculoskeletal:  No deformities, BUE and BLE strength normal and equal Skin: warm and dry  Neuro:  CNs 2-12 intact, no focal abnormalities noted Psych:  Normal affect   EKG:  The EKG was personally reviewed and demonstrates:  Sinus rhythm, HR 69, TWI III and AVF, no prior for comparison Telemetry:  Telemetry was personally reviewed and demonstrates:  Sinus bradycardia with HR in the 50s  Relevant CV Studies:  Calcium score 07/10/19: IMPRESSION: 1. Patient's total coronary artery calcium score is 343 which is 92nd percentile for patient's of matched age, gender and race/ethnicity. Please note that although the presence of coronary artery calcium documents the presence of coronary artery disease, the severity of this disease  and any potential stenosis cannot be assessed on this noncontrast CT examination. Assessment for potential risk factor modification, dietary therapy or pharmacologic therapy may be warranted, if clinically indicated. 2.  Aortic Atherosclerosis (ICD10-I70.0). 3. Old granulomatous disease, as above.  Laboratory Data:  High Sensitivity Troponin:   Recent Labs  Lab 03/26/20 2250 03/27/20 0056  TROPONINIHS 507* 513*     Chemistry Recent Labs  Lab 03/26/20 2250  NA 139  K 3.7  CL 102  CO2 27  GLUCOSE 104*  BUN 12  CREATININE 1.21  CALCIUM 9.4  GFRNONAA >60  GFRAA >60  ANIONGAP 10    Recent Labs  Lab 03/26/20 2250  PROT 7.8  ALBUMIN 4.1  AST 28  ALT 22  ALKPHOS 79  BILITOT 0.9   Hematology Recent Labs  Lab 03/26/20 2250 03/27/20 0800  WBC 9.7 7.8  RBC 4.77 4.56  HGB 15.2 14.8  HCT 44.1 42.3  MCV 92.5 92.8  MCH 31.9 32.5  MCHC 34.5 35.0  RDW 12.3 12.4  PLT 248 233   BNP Recent Labs  Lab 03/26/20 2252  BNP 42.0    DDimer No results for input(s): DDIMER in the last 168 hours.   Radiology/Studies:  DG Chest 2 View  Result Date: 03/26/2020 CLINICAL DATA:  Shortness of breath. EXAM: CHEST - 2 VIEW COMPARISON:  None. FINDINGS: The heart size and mediastinal contours are within normal limits. Both lungs are clear. No pneumothorax or pleural effusion is noted. The visualized skeletal structures are unremarkable. IMPRESSION: No active cardiopulmonary disease. Electronically Signed   By: Lupita Raider M.D.   On: 03/26/2020 16:44       TIMI Risk Score for Unstable Angina or Non-ST Elevation MI:   The patient's TIMI risk score is 4, which indicates a 20% risk of all cause mortality, new or recurrent myocardial infarction or need for urgent revascularization in the next 14 days.   Assessment and Plan:   Chest pain Aortic atherosclerosis Coronary artery disease  - calcium score of 343 for a 92nd percentile by CT scan 06/2019 - pt presents with symptoms  concerning for stable angina - chest pressure with exertion relieved by rest - hs troponin 507 --> 513 - EKG with TWI III and AVF - given his known atherosclerosis and calcium score, risk factors, and presentation - favor definitive angiography today - continue heparin drip - will start IVF for hydration - received 324 mg ASA last night - will hold off on BB for now given bradycardia in the 50s on telemetry  The patient understands that risks included but are not limited to stroke (1 in 1000), death (1 in 1000), kidney failure [usually temporary] (1 in 500), bleeding (1 in 200), allergic reaction [possibly serious] (1 in 200).  The patient understands the risks of serious complication is 1-2 in 1000 with diagnostic cardiac cath and 1-2% or less with angioplasty/stenting. The patient understands and agrees to proceed.    Hypertension - on 50 mg losartan   Hyperlipidemia - 5 mg crestor - will obtain updated lipid profile - I have increased crestor to 40 mg pending cath   Obesity - will benefit from increased exercise   Former smoker - congratulated him on his continued cessation      For questions or updates, please contact [facility] Please consult [email or web address] for contact info under     Signed, Marcelino Duster, PA  03/27/2020 8:54 AM

## 2020-03-27 NOTE — Progress Notes (Signed)
To lab 3

## 2020-03-27 NOTE — Progress Notes (Signed)
Arrived to cath lab holding from [facility]. Heparin infusing at 1300U/hr to rt ac w/0.9NS at 50cc/hr

## 2020-03-27 NOTE — Plan of Care (Signed)
  Problem: Education: Goal: Knowledge of General Education information will improve Description: Including pain rating scale, medication(s)/side effects and non-pharmacologic comfort measures Outcome: Progressing   Problem: Clinical Measurements: Goal: Cardiovascular complication will be avoided Outcome: Progressing   Problem: Coping: Goal: Level of anxiety will decrease Outcome: Progressing   Problem: Elimination: Goal: Will not experience complications related to urinary retention Outcome: Progressing   Problem: Pain Managment: Goal: General experience of comfort will improve Outcome: Progressing   Problem: Cardiovascular: Goal: Vascular access site(s) Level 0-1 will be maintained Outcome: Progressing

## 2020-03-28 ENCOUNTER — Encounter: Payer: Self-pay | Admitting: Cardiology

## 2020-03-28 ENCOUNTER — Inpatient Hospital Stay (HOSPITAL_COMMUNITY): Payer: 59

## 2020-03-28 DIAGNOSIS — I10 Essential (primary) hypertension: Secondary | ICD-10-CM

## 2020-03-28 DIAGNOSIS — I724 Aneurysm of artery of lower extremity: Secondary | ICD-10-CM | POA: Diagnosis not present

## 2020-03-28 DIAGNOSIS — I361 Nonrheumatic tricuspid (valve) insufficiency: Secondary | ICD-10-CM

## 2020-03-28 DIAGNOSIS — I2119 ST elevation (STEMI) myocardial infarction involving other coronary artery of inferior wall: Secondary | ICD-10-CM | POA: Diagnosis not present

## 2020-03-28 DIAGNOSIS — E669 Obesity, unspecified: Secondary | ICD-10-CM

## 2020-03-28 DIAGNOSIS — E785 Hyperlipidemia, unspecified: Secondary | ICD-10-CM

## 2020-03-28 LAB — BASIC METABOLIC PANEL
Anion gap: 13 (ref 5–15)
BUN: 13 mg/dL (ref 6–20)
CO2: 24 mmol/L (ref 22–32)
Calcium: 9 mg/dL (ref 8.9–10.3)
Chloride: 100 mmol/L (ref 98–111)
Creatinine, Ser: 1.2 mg/dL (ref 0.61–1.24)
GFR calc Af Amer: >60 mL/min (ref >60)
GFR calc non Af Amer: >60 mL/min (ref >60)
Glucose, Bld: 137 mg/dL — ABNORMAL HIGH (ref 70–99)
Potassium: 3.5 mmol/L (ref 3.5–5.1)
Sodium: 137 mmol/L (ref 135–145)

## 2020-03-28 LAB — CBC
HCT: 45.6 % (ref 39.0–52.0)
Hemoglobin: 15.7 g/dL (ref 13.0–17.0)
MCH: 31.2 pg (ref 26.0–34.0)
MCHC: 34.4 g/dL (ref 30.0–36.0)
MCV: 90.5 fL (ref 80.0–100.0)
Platelets: 289 10*3/uL (ref 150–400)
RBC: 5.04 MIL/uL (ref 4.22–5.81)
RDW: 12.2 % (ref 11.5–15.5)
WBC: 17.8 10*3/uL — ABNORMAL HIGH (ref 4.0–10.5)
nRBC: 0 % (ref 0.0–0.2)

## 2020-03-28 LAB — MAGNESIUM: Magnesium: 1.9 mg/dL (ref 1.7–2.4)

## 2020-03-28 LAB — ECHOCARDIOGRAM COMPLETE
Height: 71 in
Weight: 4289.27 oz

## 2020-03-28 MED ORDER — OXYCODONE-ACETAMINOPHEN 5-325 MG PO TABS
1.0000 | ORAL_TABLET | ORAL | Status: DC | PRN
  Administered 2020-03-28 – 2020-03-30 (×5): 1 via ORAL
  Filled 2020-03-28 (×5): qty 1

## 2020-03-28 MED ORDER — LISINOPRIL 5 MG PO TABS
5.0000 mg | ORAL_TABLET | Freq: Every day | ORAL | Status: DC
  Administered 2020-03-28: 5 mg via ORAL
  Filled 2020-03-28: qty 1

## 2020-03-28 MED ORDER — POTASSIUM CHLORIDE CRYS ER 20 MEQ PO TBCR
40.0000 meq | EXTENDED_RELEASE_TABLET | Freq: Once | ORAL | Status: AC
  Administered 2020-03-28: 40 meq via ORAL
  Filled 2020-03-28: qty 2

## 2020-03-28 MED ORDER — PERFLUTREN LIPID MICROSPHERE
1.0000 mL | INTRAVENOUS | Status: AC | PRN
  Administered 2020-03-28: 5 mL via INTRAVENOUS
  Filled 2020-03-28: qty 10

## 2020-03-28 MED FILL — Verapamil HCl IV Soln 2.5 MG/ML: INTRAVENOUS | Qty: 2 | Status: AC

## 2020-03-28 NOTE — Progress Notes (Signed)
Right limited lower extremity arterial duplex completed.  03/28/2020 12:19 PM Eula Fried., MHA, RVT, RDCS, RDMS

## 2020-03-28 NOTE — Progress Notes (Signed)
Patient OOB to go to [facility] and then to sit in chair.  While sitting in chair, patient went into [facility] for approximately 2 screens full 10-20 sec.  Patient states that he felt a little light headed upon happening.  Assisted back to bed and Dr. Flora Lipps paged.

## 2020-03-28 NOTE — Progress Notes (Signed)
Notified by nursing that patient went to the bathroom. Upon returning, he sat in the chair and became lightheaded. Telemetry revealed about 20 sec of Vtach with spontaneous conversion back to sinus rhythm. I presented bedside. Pt denies chest, shoulder, and back pain at this time. Labs reviewed. K 3.5. Will add on a Mg and provide 40 mEq Kdur.

## 2020-03-28 NOTE — Plan of Care (Signed)
  Problem: Education: Goal: Knowledge of General Education information will improve Description: Including pain rating scale, medication(s)/side effects and non-pharmacologic comfort measures Outcome: Progressing   Problem: Clinical Measurements: Goal: Ability to maintain clinical measurements within normal limits will improve Outcome: Progressing Goal: Diagnostic test results will improve Outcome: Progressing Goal: Cardiovascular complication will be avoided Outcome: Progressing   Problem: Nutrition: Goal: Adequate nutrition will be maintained Outcome: Progressing   Problem: Coping: Goal: Level of anxiety will decrease Outcome: Progressing   Problem: Elimination: Goal: Will not experience complications related to urinary retention Outcome: Progressing

## 2020-03-28 NOTE — Progress Notes (Signed)
  Echocardiogram 2D Echocardiogram has been performed with [facility].  Gerda Diss 03/28/2020, 9:00 AM

## 2020-03-28 NOTE — Progress Notes (Signed)
   03/28/20 1554  Clinical Encounter Type  Visited With Patient  Visit Type Initial  Referral From Nurse  Consult/Referral To Chaplain  Spiritual Encounters  Spiritual Needs Literature   Chaplain responded to consult for advanced directive. James Cameron said he did not request an AD, he also did not know what an AD was. Chaplain asked if James Cameron would like Chaplain to leave paperwork for him to read over. James Cameron agreed. Chaplain informed James Cameron to have [employer] if he decides to complete the AD paperwork. Chaplains remain available for support as needs arise.   Chaplain Resident, Amado Coe, M Div [phone] on-call pager

## 2020-03-28 NOTE — Progress Notes (Signed)
I spoke with Dr. Santiago Glad with Cardiology about administration of Metoprolol see MAR. Dr. Santiago Glad said to administer 2200 dose, hold for HR of <60 or SBP <90. Informed of pt BP of 102/44 MAP 59 & HR 64. Will continue to monitor.

## 2020-03-28 NOTE — Progress Notes (Signed)
Cardiology Progress Note  Patient ID: Duel Wentzel MRN: [id number] DOB: 10/13/1961 Date of Encounter: 03/28/2020  Primary Cardiologist: Reatha Harps, MD  Subjective  Admitted as NSTEMI that progressed to STEMI. S/p PCI to RCA/LCX. Residual disease in LAD. Reports R arm soreness.   ROS:  All other ROS reviewed and negative. Pertinent positives noted in the HPI.     Inpatient Medications  Scheduled Meds: . aspirin EC  81 mg Oral Daily  . Chlorhexidine Gluconate Cloth  6 each Topical Daily  . enoxaparin (LOVENOX) injection  40 mg Subcutaneous Q24H  . lisinopril  5 mg Oral Daily  . metoprolol tartrate  12.5 mg Oral BID  . rosuvastatin  40 mg Oral Daily  . sodium chloride flush  3 mL Intravenous Q12H  . ticagrelor  90 mg Oral BID   Continuous Infusions: . sodium chloride 10 mL/hr at 03/27/20 1544  . sodium chloride     PRN Meds: sodium chloride, acetaminophen, morphine injection, nitroGLYCERIN, ondansetron (ZOFRAN) IV, oxyCODONE-acetaminophen, perflutren lipid microspheres (DEFINITY) IV suspension, sodium chloride flush   Vital Signs   Vitals:   03/28/20 0400 03/28/20 0500 03/28/20 0600 03/28/20 0700  BP: 110/84  139/69   Pulse: (!) 109 66 66 (!) 59  Resp: (!) 21 16 13 13   Temp: 98.5 F (36.9 C)     TempSrc: Oral     SpO2: 98% 97% 98% 95%  Weight:  121.6 kg    Height:        Intake/Output Summary (Last 24 hours) at 03/28/2020 0854 Last data filed at 03/28/2020 0400 Gross per 24 hour  Intake 1059.25 ml  Output 2500 ml  Net -1440.75 ml   Last 3 Weights 03/28/2020 03/27/2020 03/26/2020  Weight (lbs) 268 lb 1.3 oz 265 lb 270 lb  Weight (kg) 121.6 kg 120.203 kg 122.471 kg      Telemetry  Overnight telemetry shows SR 60s, which I personally reviewed.   ECG  The most recent ECG shows SR 68 bpm, nonspecific changes inferior leads, which I personally reviewed.   Physical Exam   Vitals:   03/28/20 0400 03/28/20 0500 03/28/20 0600 03/28/20 0700  BP: 110/84  139/69    Pulse: (!) 109 66 66 (!) 59  Resp: (!) 21 16 13 13   Temp: 98.5 F (36.9 C)     TempSrc: Oral     SpO2: 98% 97% 98% 95%  Weight:  121.6 kg    Height:         Intake/Output Summary (Last 24 hours) at 03/28/2020 0854 Last data filed at 03/28/2020 0400 Gross per 24 hour  Intake 1059.25 ml  Output 2500 ml  Net -1440.75 ml    Last 3 Weights 03/28/2020 03/27/2020 03/26/2020  Weight (lbs) 268 lb 1.3 oz 265 lb 270 lb  Weight (kg) 121.6 kg 120.203 kg 122.471 kg    Body mass index is 37.39 kg/m.   General: Well nourished, well developed, in no acute distress Head: Atraumatic, normal size  Eyes: PEERLA, EOMI  Neck: Supple, no JVD Endocrine: No thryomegaly Cardiac: Normal S1, S2; RRR; no murmurs, rubs, or gallops Lungs: Clear to auscultation bilaterally, no wheezing, rhonchi or rales  Abd: Soft, nontender, no hepatomegaly  Ext: No edema, pulses 2+ Musculoskeletal: No deformities, BUE and BLE strength normal and equal Skin: Warm and dry, no rashes   Neuro: Alert and oriented to person, place, time, and situation, CNII-XII grossly intact, no focal deficits  Psych: Normal mood and affect   Labs  High  Sensitivity Troponin:   Recent Labs  Lab 03/26/20 2250 03/27/20 0056  TROPONINIHS 507* 513*     Cardiac EnzymesNo results for input(s): TROPONINI in the last 168 hours. No results for input(s): TROPIPOC in the last 168 hours.  Chemistry Recent Labs  Lab 03/26/20 2250 03/27/20 1800 03/28/20 0550  NA 139  --  137  K 3.7  --  3.5  CL 102  --  100  CO2 27  --  24  GLUCOSE 104*  --  137*  BUN 12  --  13  CREATININE 1.21 1.27* 1.20  CALCIUM 9.4  --  9.0  PROT 7.8  --   --   ALBUMIN 4.1  --   --   AST 28  --   --   ALT 22  --   --   ALKPHOS 79  --   --   BILITOT 0.9  --   --   GFRNONAA >60 >60 >60  GFRAA >60 >60 >60  ANIONGAP 10  --  13    Hematology Recent Labs  Lab 03/27/20 0800 03/27/20 1800 03/28/20 0550  WBC 7.8 14.9* 17.8*  RBC 4.56 5.07 5.04  HGB 14.8 16.3 15.7   HCT 42.3 44.9 45.6  MCV 92.8 88.6 90.5  MCH 32.5 32.1 31.2  MCHC 35.0 36.3* 34.4  RDW 12.4 12.1 12.2  PLT 233 292 289   BNP Recent Labs  Lab 03/26/20 2252  BNP 42.0    DDimer No results for input(s): DDIMER in the last 168 hours.   Radiology  DG Chest 2 View  Result Date: 03/26/2020 CLINICAL DATA:  Shortness of breath. EXAM: CHEST - 2 VIEW COMPARISON:  None. FINDINGS: The heart size and mediastinal contours are within normal limits. Both lungs are clear. No pneumothorax or pleural effusion is noted. The visualized skeletal structures are unremarkable. IMPRESSION: No active cardiopulmonary disease. Electronically Signed   By: Lupita Raider M.D.   On: 03/26/2020 16:44   CARDIAC CATHETERIZATION  Result Date: 03/27/2020  CULPRIT lesion: Prox Cx to Mid Cx lesion is 99% stenosed with 99% stenosed side branch in 2nd Mrg.  A drug-eluting stent was successfully placed using a SYNERGY XD 3.50X24 -> postdilated to 4.1 mm  Post intervention, there is a 0% residual stenosis in the main LCx however the side branch was occluded-100% residual stenosis.  --------  LESION #2: Mid RCA to Dist RCA lesion is 99% stenosed.  A drug-eluting stent was successfully placed using a SYNERGY XD 2.50X16 -postdilated to 2.75 mm  Post intervention, there is a 0% residual stenosis.  --------  Tandem LAD lesions: Prox LAD lesion is 40% stenosed. Mid LAD-1 lesion is 65% stenosed. Mid LAD-2 lesion is 40% stenosed.  -----------------------------------------  There is mild left ventricular systolic dysfunction. The left ventricular ejection fraction is 45-50% by visual estimate.  LV end diastolic pressure is severely elevated prior to PCI, reduced to normal level of post PCI.  SEVERE TWO-VESSEL WITH MODERATE THIRD VESSEL CAD:  Long 95 to 99% proximal to mid LCx (culprit lesion) with TIMI I flow -->  Successful DES PCI using Synergy DES 3.5 mm x 24 mm postdilated to 4.1 mm.  Additional 99% mid-distal RCA focal  lesion (treated due to ongoing pain) ->  DES PCI using Synergy DES 2.5 mm 16 mm postdilated to 2.7 mm.  Tandem 40%, 60% and 40% lesions in the proximal to mid LAD on either side of first diagonal branch.  Plan for now to treat medically  and assess after recovery from MI with two-vessel PCI.  Low normal to mildly reduced LVEF with initially severely elevated LVEDP that reduced after PCI and diuresis from 33 down to 12 mmHg. --> Admitted to CCU for ongoing care.  Manual pressure for sheath removal.   Cardiac Studies  LHC 03/27/2020  CULPRIT lesion: Prox Cx to Mid Cx lesion is 99% stenosed with 99% stenosed side branch in 2nd Mrg.  A drug-eluting stent was successfully placed using a SYNERGY XD 3.50X24 -> postdilated to 4.1 mm  Post intervention, there is a 0% residual stenosis in the main LCx however the side branch was occluded-100% residual stenosis.  --------  LESION #2: Mid RCA to Dist RCA lesion is 99% stenosed.  A drug-eluting stent was successfully placed using a SYNERGY XD 2.50X16 -postdilated to 2.75 mm  Post intervention, there is a 0% residual stenosis.  --------  Tandem LAD lesions: Prox LAD lesion is 40% stenosed. Mid LAD-1 lesion is 65% stenosed. Mid LAD-2 lesion is 40% stenosed.  -----------------------------------------  There is mild left ventricular systolic dysfunction. The left ventricular ejection fraction is 45-50% by visual estimate.  LV end diastolic pressure is severely elevated prior to PCI, reduced to normal level of post PCI.   SEVERE TWO-VESSEL WITH MODERATE THIRD VESSEL CAD:  Long 95 to 99% proximal to mid LCx (culprit lesion) with TIMI I flow --> ? Successful DES PCI using Synergy DES 3.5 mm x 24 mm postdilated to 4.1 mm.  Additional 99% mid-distal RCA focal lesion (treated due to ongoing pain) ->  ? DES PCI using Synergy DES 2.5 mm 16 mm postdilated to 2.7 mm.  Tandem 40%, 60% and 40% lesions in the proximal to mid LAD on either side of first  diagonal branch.  ? Plan for now to treat medically and assess after recovery from MI with two-vessel PCI.  Low normal to mildly reduced LVEF with initially severely elevated LVEDP that reduced after PCI and diuresis from 33 down to 12 mmHg.   --> Admitted to CCU for ongoing care.  Manual pressure for sheath removal.  Patient Profile  59 yo M with history of former tobacco abuse, HTN, HLD, obesity who was admitted 03/27/2020 with 3-4 days of worsening chest pain consistent with NSTEMI 03/27/2020. Progressed to STEMI.   Assessment & Plan   1. STEMI -initially admitted as NSTEMI -CP continued and developed inferior ST elevation prior to cath -s/p PCI  mLCX and mRCA -60% pLAD lesion -continue ASA, brilinta, metoprolol, statin  -elevated LVEDP during cath; add lisinopril 5 mg QD -echo pending  -remain in ICU today. Symptoms started 4-5 days ago. High risk. No signs of mechanical or electrical complications currently.  -EKG this AM looks great and ST resolved  -A1c 5.9 -LDL 83 -I favor medical management of Lad lesion for now  2. HLD -statin  3. HTN -metoprolol and lisinopril as above  FEN -no IVF -dvt ppx: lovenox -diet: heart healthy -code: full   For questions or updates, please contact [place] Please consult [email or web address] for contact info under   Time Spent with Patient: I have spent a total of 25 minutes with patient reviewing hospital notes, telemetry, EKGs, labs and examining the patient as well as establishing an assessment and plan that was discussed with the patient.  > 50% of time was spent in direct patient care.    Signed, Addison Naegeli. Audie Box, [place]  03/28/2020 8:54 AM

## 2020-03-28 NOTE — Progress Notes (Signed)
Micah Flesher paged concerning rhythm of earlier.  Will come and see patient.

## 2020-03-29 DIAGNOSIS — I2119 ST elevation (STEMI) myocardial infarction involving other coronary artery of inferior wall: Secondary | ICD-10-CM | POA: Diagnosis not present

## 2020-03-29 LAB — CBC
HCT: 43.3 % (ref 39.0–52.0)
Hemoglobin: 15.2 g/dL (ref 13.0–17.0)
MCH: 32.1 pg (ref 26.0–34.0)
MCHC: 35.1 g/dL (ref 30.0–36.0)
MCV: 91.4 fL (ref 80.0–100.0)
Platelets: 285 10*3/uL (ref 150–400)
RBC: 4.74 MIL/uL (ref 4.22–5.81)
RDW: 12.2 % (ref 11.5–15.5)
WBC: 16.5 10*3/uL — ABNORMAL HIGH (ref 4.0–10.5)
nRBC: 0 % (ref 0.0–0.2)

## 2020-03-29 LAB — BASIC METABOLIC PANEL
Anion gap: 10 (ref 5–15)
BUN: 13 mg/dL (ref 6–20)
CO2: 25 mmol/L (ref 22–32)
Calcium: 8.9 mg/dL (ref 8.9–10.3)
Chloride: 102 mmol/L (ref 98–111)
Creatinine, Ser: 1.38 mg/dL — ABNORMAL HIGH (ref 0.61–1.24)
GFR calc Af Amer: >60 mL/min (ref >60)
GFR calc non Af Amer: 56 mL/min — ABNORMAL LOW (ref >60)
Glucose, Bld: 146 mg/dL — ABNORMAL HIGH (ref 70–99)
Potassium: 3.9 mmol/L (ref 3.5–5.1)
Sodium: 137 mmol/L (ref 135–145)

## 2020-03-29 LAB — GLUCOSE, CAPILLARY: Glucose-Capillary: 125 mg/dL — ABNORMAL HIGH (ref 70–99)

## 2020-03-29 NOTE — Progress Notes (Signed)
Cardiology Progress Note  Patient ID: James Cameron MRN: [id number] DOB: [birth year] Date of Encounter: 03/29/2020  Primary Cardiologist: Reatha Harps, MD  Subjective  No arrhythmia on tele. Doing well. Had reported NSVT yesterday that was artifact on my review.   ROS:  All other ROS reviewed and negative. Pertinent positives noted in the HPI.     Inpatient Medications  Scheduled Meds:  aspirin EC  81 mg Oral Daily   Chlorhexidine Gluconate Cloth  6 each Topical Daily   enoxaparin (LOVENOX) injection  40 mg Subcutaneous Q24H   metoprolol tartrate  12.5 mg Oral BID   rosuvastatin  40 mg Oral Daily   sodium chloride flush  3 mL Intravenous Q12H   ticagrelor  90 mg Oral BID   Continuous Infusions:  sodium chloride     PRN Meds: sodium chloride, acetaminophen, morphine injection, nitroGLYCERIN, ondansetron (ZOFRAN) IV, oxyCODONE-acetaminophen, sodium chloride flush   Vital Signs   Vitals:   03/29/20 0800 03/29/20 0802 03/29/20 0900 03/29/20 1000  BP: (!) 108/54  108/66 111/66  Pulse:      Resp: 18  (!) 26 14  Temp:  99.6 F (37.6 C)    TempSrc:  Oral    SpO2:  100%    Weight:      Height:        Intake/Output Summary (Last 24 hours) at 03/29/2020 1057 Last data filed at 03/29/2020 0802 Gross per 24 hour  Intake 1080 ml  Output --  Net 1080 ml   Last 3 Weights 03/28/2020 03/27/2020 03/26/2020  Weight (lbs) 268 lb 1.3 oz 265 lb 270 lb  Weight (kg) 121.6 kg 120.203 kg 122.471 kg      Telemetry  Overnight telemetry shows SR 60s, which I personally reviewed.   ECG  The most recent ECG shows SR 68 bpm, nonspecific changes inferior leads, which I personally reviewed.   Physical Exam   Vitals:   03/29/20 0800 03/29/20 0802 03/29/20 0900 03/29/20 1000  BP: (!) 108/54  108/66 111/66  Pulse:      Resp: 18  (!) 26 14  Temp:  99.6 F (37.6 C)    TempSrc:  Oral    SpO2:  100%    Weight:      Height:         Intake/Output Summary (Last 24 hours) at  03/29/2020 1057 Last data filed at 03/29/2020 0802 Gross per 24 hour  Intake 1080 ml  Output --  Net 1080 ml    Last 3 Weights 03/28/2020 03/27/2020 03/26/2020  Weight (lbs) 268 lb 1.3 oz 265 lb 270 lb  Weight (kg) 121.6 kg 120.203 kg 122.471 kg    Body mass index is 37.39 kg/m.   General: Well nourished, well developed, in no acute distress Head: Atraumatic, normal size  Eyes: PEERLA, EOMI  Neck: Supple, no JVD Endocrine: No thryomegaly Cardiac: Normal S1, S2; RRR; no murmurs, rubs, or gallops Lungs: Clear to auscultation bilaterally, no wheezing, rhonchi or rales  Abd: Soft, nontender, no hepatomegaly  Ext: No edema, pulses 2+ Musculoskeletal: No deformities, BUE and BLE strength normal and equal Skin: Warm and dry, no rashes   Neuro: Alert and oriented to person, place, time, and situation, CNII-XII grossly intact, no focal deficits  Psych: Normal mood and affect   Labs  High Sensitivity Troponin:   Recent Labs  Lab 03/26/20 2250 03/27/20 0056  TROPONINIHS 507* 513*     Cardiac EnzymesNo results for input(s): TROPONINI in the last 168 hours. No  results for input(s): TROPIPOC in the last 168 hours.  Chemistry Recent Labs  Lab 03/26/20 2250 03/26/20 2250 03/27/20 1800 03/28/20 0550 03/29/20 0258  NA 139  --   --  137 137  K 3.7  --   --  3.5 3.9  CL 102  --   --  100 102  CO2 27  --   --  24 25  GLUCOSE 104*  --   --  137* 146*  BUN 12  --   --  13 13  CREATININE 1.21   < > 1.27* 1.20 1.38*  CALCIUM 9.4  --   --  9.0 8.9  PROT 7.8  --   --   --   --   ALBUMIN 4.1  --   --   --   --   AST 28  --   --   --   --   ALT 22  --   --   --   --   ALKPHOS 79  --   --   --   --   BILITOT 0.9  --   --   --   --   GFRNONAA >60   < > >60 >60 56*  GFRAA >60   < > >60 >60 >60  ANIONGAP 10  --   --  13 10   < > = values in this interval not displayed.    Hematology Recent Labs  Lab 03/27/20 1800 03/28/20 0550 03/29/20 0258  WBC 14.9* 17.8* 16.5*  RBC 5.07 5.04 4.74   HGB 16.3 15.7 15.2  HCT 44.9 45.6 43.3  MCV 88.6 90.5 91.4  MCH 32.1 31.2 32.1  MCHC 36.3* 34.4 35.1  RDW 12.1 12.2 12.2  PLT 292 289 285   BNP Recent Labs  Lab 03/26/20 2252  BNP 42.0    DDimer No results for input(s): DDIMER in the last 168 hours.   Radiology  CARDIAC CATHETERIZATION  Result Date: 03/27/2020  CULPRIT lesion: Prox Cx to Mid Cx lesion is 99% stenosed with 99% stenosed side branch in 2nd Mrg.  A drug-eluting stent was successfully placed using a SYNERGY XD 3.50X24 -> postdilated to 4.1 mm  Post intervention, there is a 0% residual stenosis in the main LCx however the side branch was occluded-100% residual stenosis.  --------  LESION #2: Mid RCA to Dist RCA lesion is 99% stenosed.  A drug-eluting stent was successfully placed using a SYNERGY XD 2.50X16 -postdilated to 2.75 mm  Post intervention, there is a 0% residual stenosis.  --------  Tandem LAD lesions: Prox LAD lesion is 40% stenosed. Mid LAD-1 lesion is 65% stenosed. Mid LAD-2 lesion is 40% stenosed.  -----------------------------------------  There is mild left ventricular systolic dysfunction. The left ventricular ejection fraction is 45-50% by visual estimate.  LV end diastolic pressure is severely elevated prior to PCI, reduced to normal level of post PCI.  SEVERE TWO-VESSEL WITH MODERATE THIRD VESSEL CAD:  Long 95 to 99% proximal to mid LCx (culprit lesion) with TIMI I flow -->  Successful DES PCI using Synergy DES 3.5 mm x 24 mm postdilated to 4.1 mm.  Additional 99% mid-distal RCA focal lesion (treated due to ongoing pain) ->  DES PCI using Synergy DES 2.5 mm 16 mm postdilated to 2.7 mm.  Tandem 40%, 60% and 40% lesions in the proximal to mid LAD on either side of first diagonal branch.  Plan for now to treat medically and assess after recovery from MI with  two-vessel PCI.  Low normal to mildly reduced LVEF with initially severely elevated LVEDP that reduced after PCI and diuresis from 33 down to  12 mmHg. --> Admitted to CCU for ongoing care.  Manual pressure for sheath removal.  VAS [place] GROIN PSEUDOANEURYSM  Result Date: 03/28/2020  ARTERIAL PSEUDOANEURYSM  Exam: Right groin Indications: Patient complains of palpable knot. History: S/p catheterization. Comparison Study: No prior study Performing Technologist: Gertie Fey MHA, RDMS, RVT, RDCS  Examination Guidelines: A complete evaluation includes B-mode imaging, spectral Doppler, color Doppler, and power Doppler as needed of all accessible portions of each vessel. Bilateral testing is considered an integral part of a complete examination. Limited examinations for reoccurring indications may be performed as noted. +------------+----------+---------+------+----------+  Right Duplex PSV (cm/s) Waveform  Plaque Comment(s)  +------------+----------+---------+------+----------+  CFA             107     triphasic                    +------------+----------+---------+------+----------+  Prox SFA         97     triphasic                    +------------+----------+---------+------+----------+ Right Vein comments:CFV patent with no evidence of thrombus   Summary: No evidence of pseudoaneurysm, AVF or DVT  Findings:  A mixed echogenic structure measuring approximately 1.5 cm x 1.7 cm is visualized at the right groin with ultrasound characteristics of a hematoma. Diagnosing physician: Sherald Hess MD Electronically signed by Sherald Hess MD on 03/28/2020 at 3:59:01 PM.   --------------------------------------------------------------------------------    Final    ECHOCARDIOGRAM COMPLETE  Result Date: 03/28/2020    ECHOCARDIOGRAM REPORT   Patient Name:   James Cameron Date of Exam: 03/28/2020 Medical Rec #:  [id number]        Height:       71.0 in Accession #:    [id number]       Weight:       268.1 lb Date of Birth:  1961-01-14         BSA:          2.388 m Patient Age:    59 years         BP:           139/69 mmHg Patient Gender: M                 HR:           65 bpm. Exam Location:  Inpatient Procedure: 2D Echo and Intracardiac Opacification Agent Indications:    NSTEMI I21.4  History:        Patient has no prior history of Echocardiogram examinations.                 Angina and Acute MI; Risk Factors:Former Smoker, Hypertension                 and Dyslipidemia.  Sonographer:    Ross Ludwig RDCS (AE) Referring Phys: [id number] DAVID W HARDING IMPRESSIONS  1. Left ventricular ejection fraction, by estimation, is 60 to 65%. The left ventricle has normal function. The left ventricle has no regional wall motion abnormalities. There is mild left ventricular hypertrophy. Left ventricular diastolic parameters were normal.  2. Right ventricular systolic function is normal. The right ventricular size is normal.  3. The mitral valve is normal in structure. No evidence of mitral valve regurgitation. No evidence of mitral stenosis.  4. The aortic valve is tricuspid. Aortic valve regurgitation is not visualized. Mild aortic valve sclerosis is present, with no evidence of aortic valve stenosis.  5. The inferior vena cava is normal in size with greater than 50% respiratory variability, suggesting right atrial pressure of 3 mmHg. FINDINGS  Left Ventricle: Left ventricular ejection fraction, by estimation, is 60 to 65%. The left ventricle has normal function. The left ventricle has no regional wall motion abnormalities. Definity contrast agent was given IV to delineate the left ventricular  endocardial borders. The left ventricular internal cavity size was normal in size. There is mild left ventricular hypertrophy. Left ventricular diastolic parameters were normal. Right Ventricle: The right ventricular size is normal. No increase in right ventricular wall thickness. Right ventricular systolic function is normal. Left Atrium: Left atrial size was normal in size. Right Atrium: Right atrial size was normal in size. Pericardium: There is no evidence of pericardial effusion. Mitral  Valve: The mitral valve is normal in structure. Normal mobility of the mitral valve leaflets. No evidence of mitral valve regurgitation. No evidence of mitral valve stenosis. Tricuspid Valve: The tricuspid valve is normal in structure. Tricuspid valve regurgitation is mild . No evidence of tricuspid stenosis. Aortic Valve: The aortic valve is tricuspid. Aortic valve regurgitation is not visualized. Mild aortic valve sclerosis is present, with no evidence of aortic valve stenosis. Aortic valve mean gradient measures 5.0 mmHg. Aortic valve peak gradient measures 9.7 mmHg. Aortic valve area, by VTI measures 2.89 cm. Pulmonic Valve: The pulmonic valve was normal in structure. Pulmonic valve regurgitation is not visualized. No evidence of pulmonic stenosis. Aorta: The aortic root is normal in size and structure. Venous: The inferior vena cava is normal in size with greater than 50% respiratory variability, suggesting right atrial pressure of 3 mmHg. IAS/Shunts: The interatrial septum was not well visualized.  LEFT VENTRICLE PLAX 2D LVIDd:         4.60 cm  Diastology LVIDs:         3.50 cm  LV e' lateral:   8.70 cm/s LV PW:         1.60 cm  LV E/e' lateral: 9.5 LV IVS:        1.30 cm  LV e' medial:    7.07 cm/s LVOT diam:     2.20 cm  LV E/e' medial:  11.7 LV SV:         80 LV SV Index:   34 LVOT Area:     3.80 cm  RIGHT VENTRICLE RV Basal diam:  3.40 cm RV S prime:     17.70 cm/s TAPSE (M-mode): 2.6 cm LEFT ATRIUM             Index       RIGHT ATRIUM           Index LA diam:        3.20 cm 1.34 cm/m  RA Area:     18.10 cm LA Vol (A2C):   61.1 ml 25.58 ml/m RA Volume:   51.40 ml  21.52 ml/m LA Vol (A4C):   86.5 ml 36.22 ml/m LA Biplane Vol: 76.4 ml 31.99 ml/m  AORTIC VALVE AV Area (Vmax):    2.85 cm AV Area (Vmean):   2.75 cm AV Area (VTI):     2.89 cm AV Vmax:           156.00 cm/s AV Vmean:          103.000 cm/s AV VTI:  0.278 m AV Peak Grad:      9.7 mmHg AV Mean Grad:      5.0 mmHg LVOT Vmax:          117.00 cm/s LVOT Vmean:        74.400 cm/s LVOT VTI:          0.211 m LVOT/AV VTI ratio: 0.76  AORTA Ao Root diam: 3.30 cm Ao Asc diam:  3.60 cm MITRAL VALVE MV Area (PHT): 3.99 cm    SHUNTS MV Decel Time: 190 msec    Systemic VTI:  0.21 m MV E velocity: 82.90 cm/s  Systemic Diam: 2.20 cm MV A velocity: 78.60 cm/s MV E/A ratio:  1.05 Jenkins Rouge MD Electronically signed by Jenkins Rouge MD Signature Date/Time: 03/28/2020/9:58:20 AM    Final     Cardiac Studies  LHC 03/27/2020  CULPRIT lesion: Prox Cx to Mid Cx lesion is 99% stenosed with 99% stenosed side branch in 2nd Mrg.  A drug-eluting stent was successfully placed using a SYNERGY XD 3.50X24 -> postdilated to 4.1 mm  Post intervention, there is a 0% residual stenosis in the main LCx however the side branch was occluded-100% residual stenosis.  --------  LESION #2: Mid RCA to Dist RCA lesion is 99% stenosed.  A drug-eluting stent was successfully placed using a SYNERGY XD 2.50X16 -postdilated to 2.75 mm  Post intervention, there is a 0% residual stenosis.  --------  Tandem LAD lesions: Prox LAD lesion is 40% stenosed. Mid LAD-1 lesion is 65% stenosed. Mid LAD-2 lesion is 40% stenosed.  -----------------------------------------  There is mild left ventricular systolic dysfunction. The left ventricular ejection fraction is 45-50% by visual estimate.  LV end diastolic pressure is severely elevated prior to PCI, reduced to normal level of post PCI.   SEVERE TWO-VESSEL WITH MODERATE THIRD VESSEL CAD:  Long 95 to 99% proximal to mid LCx (culprit lesion) with TIMI I flow --> ? Successful DES PCI using Synergy DES 3.5 mm x 24 mm postdilated to 4.1 mm.  Additional 99% mid-distal RCA focal lesion (treated due to ongoing pain) ->  ? DES PCI using Synergy DES 2.5 mm 16 mm postdilated to 2.7 mm.  Tandem 40%, 60% and 40% lesions in the proximal to mid LAD on either side of first diagonal branch.  ? Plan for now to treat medically and  assess after recovery from MI with two-vessel PCI.  Low normal to mildly reduced LVEF with initially severely elevated LVEDP that reduced after PCI and diuresis from 33 down to 12 mmHg.   --> Admitted to CCU for ongoing care.  Manual pressure for sheath removal.   TTE 03/28/2020 1. Left ventricular ejection fraction, by estimation, is 60 to 65%. The  left ventricle has normal function. The left ventricle has no regional  wall motion abnormalities. There is mild left ventricular hypertrophy.  Left ventricular diastolic parameters  were normal.  2. Right ventricular systolic function is normal. The right ventricular  size is normal.  3. The mitral valve is normal in structure. No evidence of mitral valve  regurgitation. No evidence of mitral stenosis.  4. The aortic valve is tricuspid. Aortic valve regurgitation is not  visualized. Mild aortic valve sclerosis is present, with no evidence of  aortic valve stenosis.  5. The inferior vena cava is normal in size with greater than 50%  respiratory variability, suggesting right atrial pressure of 3 mmHg.   Patient Profile  59 yo M with history of former tobacco abuse, HTN,  HLD, obesity who was admitted 03/27/2020 with 3-4 days of worsening chest pain consistent with NSTEMI 03/27/2020. Progressed to STEMI.   Assessment & Plan   1. STEMI -initially admitted as NSTEMI. CP continued and developed inferior ST elevation prior to cath -s/p PCI  mLCX and mRCA. 60% pLAD lesion -continue ASA, brilinta, metoprolol, statin  -elevated LVEDP during cath that resolved -EF 60-65% -EKG today looks great; transfer to floor today  -A1c 5.9 -LDL 83 -I favor medical management of Lad lesion for now  2. HLD -statin  3. HTN -metoprolol and lisinopril as above  4. R femoral hematoma -small R femoral hematoma. Stable. No indication for intervention.   FEN -no IVF -dvt ppx: lovenox -diet: heart healthy -code: full   For questions or updates,  please contact [facility] Please consult [email or web address] for contact info under   Time Spent with Patient: I have spent a total of 25 minutes with patient reviewing hospital notes, telemetry, EKGs, labs and examining the patient as well as establishing an assessment and plan that was discussed with the patient.  > 50% of time was spent in direct patient care.    Signed, Lenna GilfordWesley T. Flora Lipps'Neal, MD [facility]   [facility]  03/29/2020 10:57 AM

## 2020-03-29 NOTE — Progress Notes (Signed)
CARDIAC REHAB PHASE I   PRE:  Rate/Rhythm: Sinus Rhythm  BP:    Sitting: 93/53  Standing: 96/53   SaO2: 96% RA  MODE:  Ambulation: 400 ft   POST:  Rate/Rhythem: 79  BP:  Supine: 111/64     SaO2: 98% RA  1330-1425 Patient ambulated independently in the hallway without complaints or difficulty. Reviewed MI booklet with patient and wife.Discussed exercise instructions RPE and temperature precautions with the patient. James Cameron is interested in phase 2 cardiac rehab in [place]. Patient given stent card. Reviewed heart healthy diet, use of sublingual nitroglycerin and when to call 911.  Thayer Headings RN BSN

## 2020-03-29 NOTE — Plan of Care (Signed)
Pt and his wife educated on brilinta, metoprolol and lovenox. Melodye Ped

## 2020-03-29 NOTE — Progress Notes (Signed)
Pt transfer from 2H received in 3E-04. See flowsheet for assessment. Pt placed on tele. Pt denies pain or SOB. Will con't to monitor.

## 2020-03-29 NOTE — Care Management (Signed)
Patient and wife given Brilinta cards.

## 2020-03-30 ENCOUNTER — Other Ambulatory Visit: Payer: Self-pay

## 2020-03-30 DIAGNOSIS — I2119 ST elevation (STEMI) myocardial infarction involving other coronary artery of inferior wall: Secondary | ICD-10-CM | POA: Diagnosis not present

## 2020-03-30 LAB — BASIC METABOLIC PANEL
Anion gap: 8 (ref 5–15)
BUN: 16 mg/dL (ref 6–20)
CO2: 25 mmol/L (ref 22–32)
Calcium: 8.5 mg/dL — ABNORMAL LOW (ref 8.9–10.3)
Chloride: 103 mmol/L (ref 98–111)
Creatinine, Ser: 1.43 mg/dL — ABNORMAL HIGH (ref 0.61–1.24)
GFR calc Af Amer: >60 mL/min (ref >60)
GFR calc non Af Amer: 54 mL/min — ABNORMAL LOW (ref >60)
Glucose, Bld: 146 mg/dL — ABNORMAL HIGH (ref 70–99)
Potassium: 3.5 mmol/L (ref 3.5–5.1)
Sodium: 136 mmol/L (ref 135–145)

## 2020-03-30 LAB — CBC
HCT: 39.9 % (ref 39.0–52.0)
Hemoglobin: 13.7 g/dL (ref 13.0–17.0)
MCH: 31.4 pg (ref 26.0–34.0)
MCHC: 34.3 g/dL (ref 30.0–36.0)
MCV: 91.3 fL (ref 80.0–100.0)
Platelets: 228 10*3/uL (ref 150–400)
RBC: 4.37 MIL/uL (ref 4.22–5.81)
RDW: 12 % (ref 11.5–15.5)
WBC: 12.1 10*3/uL — ABNORMAL HIGH (ref 4.0–10.5)
nRBC: 0 % (ref 0.0–0.2)

## 2020-03-30 NOTE — Plan of Care (Signed)

## 2020-03-30 NOTE — Progress Notes (Signed)
Cardiology Progress Note  Patient ID: James Cameron MRN: [id number] DOB: 02/17/1961 Date of Encounter: 03/30/2020  Primary Cardiologist: Reatha HarpsWesley T O'Neal, MD  Subjective  Reports no further symptoms. Had some leg pain. Tele stable.   ROS:  All other ROS reviewed and negative. Pertinent positives noted in the HPI.     Inpatient Medications  Scheduled Meds:  aspirin EC  81 mg Oral Daily   Chlorhexidine Gluconate Cloth  6 each Topical Daily   enoxaparin (LOVENOX) injection  40 mg Subcutaneous Q24H   metoprolol tartrate  12.5 mg Oral BID   rosuvastatin  40 mg Oral Daily   sodium chloride flush  3 mL Intravenous Q12H   ticagrelor  90 mg Oral BID   Continuous Infusions:  sodium chloride     PRN Meds: sodium chloride, acetaminophen, morphine injection, nitroGLYCERIN, ondansetron (ZOFRAN) IV, oxyCODONE-acetaminophen, sodium chloride flush   Vital Signs   Vitals:   03/30/20 0026 03/30/20 0314 03/30/20 0318 03/30/20 0817  BP: 105/62 (!) 118/58  108/60  Pulse: 79 77  79  Resp: 19 16  20   Temp: 99.3 F (37.4 C) 99.3 F (37.4 C)  97.8 F (36.6 C)  TempSrc: Oral Oral  Oral  SpO2: 96% 96%  99%  Weight:   117.6 kg   Height:        Intake/Output Summary (Last 24 hours) at 03/30/2020 0858 Last data filed at 03/30/2020 0321 Gross per 24 hour  Intake 720 ml  Output 550 ml  Net 170 ml   Last 3 Weights 03/30/2020 03/29/2020 03/28/2020  Weight (lbs) 259 lb 3.2 oz 259 lb 4.8 oz 268 lb 1.3 oz  Weight (kg) 117.572 kg 117.618 kg 121.6 kg      Telemetry  Overnight telemetry shows SR 60s, which I personally reviewed.   ECG  The most recent ECG shows SR 68 bpm, nonspecific changes inferior leads, which I personally reviewed.   Physical Exam   Vitals:   03/30/20 0026 03/30/20 0314 03/30/20 0318 03/30/20 0817  BP: 105/62 (!) 118/58  108/60  Pulse: 79 77  79  Resp: 19 16  20   Temp: 99.3 F (37.4 C) 99.3 F (37.4 C)  97.8 F (36.6 C)  TempSrc: Oral Oral  Oral  SpO2: 96%  96%  99%  Weight:   117.6 kg   Height:         Intake/Output Summary (Last 24 hours) at 03/30/2020 0858 Last data filed at 03/30/2020 0321 Gross per 24 hour  Intake 720 ml  Output 550 ml  Net 170 ml    Last 3 Weights 03/30/2020 03/29/2020 03/28/2020  Weight (lbs) 259 lb 3.2 oz 259 lb 4.8 oz 268 lb 1.3 oz  Weight (kg) 117.572 kg 117.618 kg 121.6 kg    Body mass index is 36.15 kg/m.   General: Well nourished, well developed, in no acute distress Head: Atraumatic, normal size  Eyes: PEERLA, EOMI  Neck: Supple, no JVD Endocrine: No thryomegaly Cardiac: Normal S1, S2; RRR; no murmurs, rubs, or gallops Lungs: Clear to auscultation bilaterally, no wheezing, rhonchi or rales  Abd: Soft, nontender, no hepatomegaly  Ext: No edema, pulses 2+, R femoral cath site with knot, pulses good  Musculoskeletal: No deformities, BUE and BLE strength normal and equal Skin: Warm and dry, no rashes   Neuro: Alert and oriented to person, place, time, and situation, CNII-XII grossly intact, no focal deficits  Psych: Normal mood and affect   Labs  High Sensitivity Troponin:   Recent Labs  Lab 03/26/20 2250 03/27/20 0056  TROPONINIHS 507* 513*     Cardiac EnzymesNo results for input(s): TROPONINI in the last 168 hours. No results for input(s): TROPIPOC in the last 168 hours.  Chemistry Recent Labs  Lab 03/26/20 2250 03/27/20 1800 03/28/20 0550 03/29/20 0258 03/30/20 0359  NA 139  --  137 137 136  K 3.7  --  3.5 3.9 3.5  CL 102  --  100 102 103  CO2 27  --  24 25 25   GLUCOSE 104*  --  137* 146* 146*  BUN 12  --  13 13 16   CREATININE 1.21   < > 1.20 1.38* 1.43*  CALCIUM 9.4  --  9.0 8.9 8.5*  PROT 7.8  --   --   --   --   ALBUMIN 4.1  --   --   --   --   AST 28  --   --   --   --   ALT 22  --   --   --   --   ALKPHOS 79  --   --   --   --   BILITOT 0.9  --   --   --   --   GFRNONAA >60   < > >60 56* 54*  GFRAA >60   < > >60 >60 >60  ANIONGAP 10  --  13 10 8    < > = values in this  interval not displayed.    Hematology Recent Labs  Lab 03/28/20 0550 03/29/20 0258 03/30/20 0359  WBC 17.8* 16.5* 12.1*  RBC 5.04 4.74 4.37  HGB 15.7 15.2 13.7  HCT 45.6 43.3 39.9  MCV 90.5 91.4 91.3  MCH 31.2 32.1 31.4  MCHC 34.4 35.1 34.3  RDW 12.2 12.2 12.0  PLT 289 285 228   BNP Recent Labs  Lab 03/26/20 2252  BNP 42.0    DDimer No results for input(s): DDIMER in the last 168 hours.   Radiology  VAS 03/31/20 GROIN PSEUDOANEURYSM  Result Date: 03/28/2020  ARTERIAL PSEUDOANEURYSM  Exam: Right groin Indications: Patient complains of palpable knot. History: S/p catheterization. Comparison Study: No prior study Performing Technologist: 03/28/20 MHA, RDMS, RVT, RDCS  Examination Guidelines: A complete evaluation includes B-mode imaging, spectral Doppler, color Doppler, and power Doppler as needed of all accessible portions of each vessel. Bilateral testing is considered an integral part of a complete examination. Limited examinations for reoccurring indications may be performed as noted. +------------+----------+---------+------+----------+  Right Duplex PSV (cm/s) Waveform  Plaque Comment(s)  +------------+----------+---------+------+----------+  CFA             107     triphasic                    +------------+----------+---------+------+----------+  Prox SFA         97     triphasic                    +------------+----------+---------+------+----------+ Right Vein comments:CFV patent with no evidence of thrombus   Summary: No evidence of pseudoaneurysm, AVF or DVT  Findings:  A mixed echogenic structure measuring approximately 1.5 cm x 1.7 cm is visualized at the right groin with ultrasound characteristics of a hematoma. Diagnosing physician: [place] MD Electronically signed by 03/30/2020 MD on 03/28/2020 at 3:59:01 PM.   --------------------------------------------------------------------------------    Final    ECHOCARDIOGRAM COMPLETE  Result Date:  03/28/2020    ECHOCARDIOGRAM REPORT  Patient Name:   James Cameron Date of Exam: 03/28/2020 Medical Rec #:  [id number]        Height:       71.0 in Accession #:    [id number]       Weight:       268.1 lb Date of Birth:  15-Apr-1961         BSA:          2.388 m Patient Age:    59 years         BP:           139/69 mmHg Patient Gender: M                HR:           65 bpm. Exam Location:  Inpatient Procedure: 2D Echo and Intracardiac Opacification Agent Indications:    NSTEMI I21.4  History:        Patient has no prior history of Echocardiogram examinations.                 Angina and Acute MI; Risk Factors:Former Smoker, Hypertension                 and Dyslipidemia.  Sonographer:    Ross Ludwig RDCS (AE) Referring Phys: [id number] DAVID W HARDING IMPRESSIONS  1. Left ventricular ejection fraction, by estimation, is 60 to 65%. The left ventricle has normal function. The left ventricle has no regional wall motion abnormalities. There is mild left ventricular hypertrophy. Left ventricular diastolic parameters were normal.  2. Right ventricular systolic function is normal. The right ventricular size is normal.  3. The mitral valve is normal in structure. No evidence of mitral valve regurgitation. No evidence of mitral stenosis.  4. The aortic valve is tricuspid. Aortic valve regurgitation is not visualized. Mild aortic valve sclerosis is present, with no evidence of aortic valve stenosis.  5. The inferior vena cava is normal in size with greater than 50% respiratory variability, suggesting right atrial pressure of 3 mmHg. FINDINGS  Left Ventricle: Left ventricular ejection fraction, by estimation, is 60 to 65%. The left ventricle has normal function. The left ventricle has no regional wall motion abnormalities. Definity contrast agent was given IV to delineate the left ventricular  endocardial borders. The left ventricular internal cavity size was normal in size. There is mild left ventricular hypertrophy. Left ventricular  diastolic parameters were normal. Right Ventricle: The right ventricular size is normal. No increase in right ventricular wall thickness. Right ventricular systolic function is normal. Left Atrium: Left atrial size was normal in size. Right Atrium: Right atrial size was normal in size. Pericardium: There is no evidence of pericardial effusion. Mitral Valve: The mitral valve is normal in structure. Normal mobility of the mitral valve leaflets. No evidence of mitral valve regurgitation. No evidence of mitral valve stenosis. Tricuspid Valve: The tricuspid valve is normal in structure. Tricuspid valve regurgitation is mild . No evidence of tricuspid stenosis. Aortic Valve: The aortic valve is tricuspid. Aortic valve regurgitation is not visualized. Mild aortic valve sclerosis is present, with no evidence of aortic valve stenosis. Aortic valve mean gradient measures 5.0 mmHg. Aortic valve peak gradient measures 9.7 mmHg. Aortic valve area, by VTI measures 2.89 cm. Pulmonic Valve: The pulmonic valve was normal in structure. Pulmonic valve regurgitation is not visualized. No evidence of pulmonic stenosis. Aorta: The aortic root is normal in size and structure. Venous: The inferior vena cava is normal in size with greater  than 50% respiratory variability, suggesting right atrial pressure of 3 mmHg. IAS/Shunts: The interatrial septum was not well visualized.  LEFT VENTRICLE PLAX 2D LVIDd:         4.60 cm  Diastology LVIDs:         3.50 cm  LV e' lateral:   8.70 cm/s LV PW:         1.60 cm  LV E/e' lateral: 9.5 LV IVS:        1.30 cm  LV e' medial:    7.07 cm/s LVOT diam:     2.20 cm  LV E/e' medial:  11.7 LV SV:         80 LV SV Index:   34 LVOT Area:     3.80 cm  RIGHT VENTRICLE RV Basal diam:  3.40 cm RV S prime:     17.70 cm/s TAPSE (M-mode): 2.6 cm LEFT ATRIUM             Index       RIGHT ATRIUM           Index LA diam:        3.20 cm 1.34 cm/m  RA Area:     18.10 cm LA Vol (A2C):   61.1 ml 25.58 ml/m RA Volume:    51.40 ml  21.52 ml/m LA Vol (A4C):   86.5 ml 36.22 ml/m LA Biplane Vol: 76.4 ml 31.99 ml/m  AORTIC VALVE AV Area (Vmax):    2.85 cm AV Area (Vmean):   2.75 cm AV Area (VTI):     2.89 cm AV Vmax:           156.00 cm/s AV Vmean:          103.000 cm/s AV VTI:            0.278 m AV Peak Grad:      9.7 mmHg AV Mean Grad:      5.0 mmHg LVOT Vmax:         117.00 cm/s LVOT Vmean:        74.400 cm/s LVOT VTI:          0.211 m LVOT/AV VTI ratio: 0.76  AORTA Ao Root diam: 3.30 cm Ao Asc diam:  3.60 cm MITRAL VALVE MV Area (PHT): 3.99 cm    SHUNTS MV Decel Time: 190 msec    Systemic VTI:  0.21 m MV E velocity: 82.90 cm/s  Systemic Diam: 2.20 cm MV A velocity: 78.60 cm/s MV E/A ratio:  1.05 Charlton Haws MD Electronically signed by Charlton Haws MD Signature Date/Time: 03/28/2020/9:58:20 AM    Final     Cardiac Studies  LHC 03/27/2020  CULPRIT lesion: Prox Cx to Mid Cx lesion is 99% stenosed with 99% stenosed side branch in 2nd Mrg.  A drug-eluting stent was successfully placed using a SYNERGY XD 3.50X24 -> postdilated to 4.1 mm  Post intervention, there is a 0% residual stenosis in the main LCx however the side branch was occluded-100% residual stenosis.  --------  LESION #2: Mid RCA to Dist RCA lesion is 99% stenosed.  A drug-eluting stent was successfully placed using a SYNERGY XD 2.50X16 -postdilated to 2.75 mm  Post intervention, there is a 0% residual stenosis.  --------  Tandem LAD lesions: Prox LAD lesion is 40% stenosed. Mid LAD-1 lesion is 65% stenosed. Mid LAD-2 lesion is 40% stenosed.  -----------------------------------------  There is mild left ventricular systolic dysfunction. The left ventricular ejection fraction is 45-50% by visual estimate.  LV  end diastolic pressure is severely elevated prior to PCI, reduced to normal level of post PCI.   SEVERE TWO-VESSEL WITH MODERATE THIRD VESSEL CAD:  Long 95 to 99% proximal to mid LCx (culprit lesion) with TIMI I flow --> ? Successful  DES PCI using Synergy DES 3.5 mm x 24 mm postdilated to 4.1 mm.  Additional 99% mid-distal RCA focal lesion (treated due to ongoing pain) ->  ? DES PCI using Synergy DES 2.5 mm 16 mm postdilated to 2.7 mm.  Tandem 40%, 60% and 40% lesions in the proximal to mid LAD on either side of first diagonal branch.  ? Plan for now to treat medically and assess after recovery from MI with two-vessel PCI.  Low normal to mildly reduced LVEF with initially severely elevated LVEDP that reduced after PCI and diuresis from 33 down to 12 mmHg.   --> Admitted to CCU for ongoing care.  Manual pressure for sheath removal.   TTE 03/28/2020 1. Left ventricular ejection fraction, by estimation, is 60 to 65%. The  left ventricle has normal function. The left ventricle has no regional  wall motion abnormalities. There is mild left ventricular hypertrophy.  Left ventricular diastolic parameters  were normal.  2. Right ventricular systolic function is normal. The right ventricular  size is normal.  3. The mitral valve is normal in structure. No evidence of mitral valve  regurgitation. No evidence of mitral stenosis.  4. The aortic valve is tricuspid. Aortic valve regurgitation is not  visualized. Mild aortic valve sclerosis is present, with no evidence of  aortic valve stenosis.  5. The inferior vena cava is normal in size with greater than 50%  respiratory variability, suggesting right atrial pressure of 3 mmHg.   Patient Profile  59 yo M with history of former tobacco abuse, HTN, HLD, obesity who was admitted 03/27/2020 with 3-4 days of worsening chest pain consistent with NSTEMI 03/27/2020. Progressed to STEMI.   Assessment & Plan   1. STEMI -initially admitted as NSTEMI. CP continued and developed inferior ST elevation prior to cath -s/p PCI  mLCX and mRCA. 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 HLD -statin  3. HTN -metoprolol and lisinopril as above  4. R femoral hematoma -small R femoral hematoma. Stable. No indication for intervention.   FEN -no IVF -dvt ppx: lovenox -diet: heart healthy -code: full  -disposition: anticipate DC tomorrow   For questions or updates, please contact [place] Please consult [email or web address] for contact info under   Time Spent with Patient: I have spent a total of 25 minutes with patient reviewing hospital notes, telemetry, EKGs, labs and examining the patient as well as establishing an assessment and plan that was discussed with the patient.  > 50% of time was spent in direct patient care.    Signed, Addison Naegeli. Audie Box, [place]  03/30/2020 8:58 AM

## 2020-03-30 NOTE — Plan of Care (Signed)
  Problem: Education: Goal: Knowledge of General Education information will improve Description: Including pain rating scale, medication(s)/side effects and non-pharmacologic comfort measures Outcome: Progressing   Problem: Clinical Measurements: Goal: Ability to maintain clinical measurements within normal limits will improve Outcome: Progressing Goal: Cardiovascular complication will be avoided Outcome: Progressing   Problem: Activity: Goal: Risk for activity intolerance will decrease Outcome: Progressing   Problem: Skin Integrity: Goal: Risk for impaired skin integrity will decrease Outcome: Progressing   Problem: Elimination: Goal: Will not experience complications related to bowel motility Outcome: Adequate for Discharge

## 2020-03-31 ENCOUNTER — Telehealth: Payer: Self-pay | Admitting: Physician Assistant

## 2020-03-31 DIAGNOSIS — I2119 ST elevation (STEMI) myocardial infarction involving other coronary artery of inferior wall: Secondary | ICD-10-CM | POA: Diagnosis not present

## 2020-03-31 LAB — BASIC METABOLIC PANEL
Anion gap: 11 (ref 5–15)
BUN: 13 mg/dL (ref 6–20)
CO2: 22 mmol/L (ref 22–32)
Calcium: 8.6 mg/dL — ABNORMAL LOW (ref 8.9–10.3)
Chloride: 100 mmol/L (ref 98–111)
Creatinine, Ser: 1.32 mg/dL — ABNORMAL HIGH (ref 0.61–1.24)
GFR calc Af Amer: >60 mL/min (ref >60)
GFR calc non Af Amer: 59 mL/min — ABNORMAL LOW (ref >60)
Glucose, Bld: 147 mg/dL — ABNORMAL HIGH (ref 70–99)
Potassium: 3.6 mmol/L (ref 3.5–5.1)
Sodium: 133 mmol/L — ABNORMAL LOW (ref 135–145)

## 2020-03-31 LAB — CBC
HCT: 38 % — ABNORMAL LOW (ref 39.0–52.0)
Hemoglobin: 13.2 g/dL (ref 13.0–17.0)
MCH: 31.9 pg (ref 26.0–34.0)
MCHC: 34.7 g/dL (ref 30.0–36.0)
MCV: 91.8 fL (ref 80.0–100.0)
Platelets: 236 10*3/uL (ref 150–400)
RBC: 4.14 MIL/uL — ABNORMAL LOW (ref 4.22–5.81)
RDW: 12 % (ref 11.5–15.5)
WBC: 10.7 10*3/uL — ABNORMAL HIGH (ref 4.0–10.5)
nRBC: 0 % (ref 0.0–0.2)

## 2020-03-31 MED ORDER — TICAGRELOR 90 MG PO TABS: 90.0000 mg | ORAL_TABLET | Freq: Two times a day (BID) | ORAL | 3 refills | Status: DC

## 2020-03-31 MED ORDER — METOPROLOL TARTRATE 25 MG PO TABS: 12.5000 mg | ORAL_TABLET | Freq: Two times a day (BID) | ORAL | 3 refills | Status: DC

## 2020-03-31 MED ORDER — NITROGLYCERIN 0.4 MG SL SUBL: 0.4000 mg | SUBLINGUAL_TABLET | SUBLINGUAL | 3 refills | Status: DC | PRN

## 2020-03-31 MED ORDER — ASPIRIN 81 MG PO TBEC: 81.0000 mg | DELAYED_RELEASE_TABLET | Freq: Every day | ORAL | 3 refills | Status: DC

## 2020-03-31 MED ORDER — ROSUVASTATIN CALCIUM 40 MG PO TABS: 40.0000 mg | ORAL_TABLET | Freq: Every day | ORAL | 3 refills | Status: DC

## 2020-03-31 MED FILL — ASPIRIN LOW DOSE 81 MG TBEC: 81 | 30 days supply | Qty: 30 | Fill #0

## 2020-03-31 MED FILL — NITROGLYCERIN 0.4 MG TAB SL: 0.4 | 7 days supply | Qty: 25 | Fill #0

## 2020-03-31 MED FILL — ROSUVASTATIN CALCIUM 40 MG: 40 | 30 days supply | Qty: 30 | Fill #0

## 2020-03-31 MED FILL — BRILINTA 90 MG TABLET: 90 | 30 days supply | Qty: 60 | Fill #0

## 2020-03-31 MED FILL — METOPROLOL TARTRATE 25 MG T: 25 | 30 days supply | Qty: 30 | Fill #0

## 2020-03-31 NOTE — Progress Notes (Signed)
Cardiology Progress Note  Patient ID: Nesanel Valcarcel MRN: [id number] DOB: 1961/06/08 Date of Encounter: 03/31/2020  Primary Cardiologist: Reatha Harps, MD  Subjective   NO CP  No SOB   Inpatient Medications  Scheduled Meds: . aspirin EC  81 mg Oral Daily  . Chlorhexidine Gluconate Cloth  6 each Topical Daily  . enoxaparin (LOVENOX) injection  40 mg Subcutaneous Q24H  . metoprolol tartrate  12.5 mg Oral BID  . rosuvastatin  40 mg Oral Daily  . sodium chloride flush  3 mL Intravenous Q12H  . ticagrelor  90 mg Oral BID   Continuous Infusions: . sodium chloride     PRN Meds: sodium chloride, acetaminophen, morphine injection, nitroGLYCERIN, ondansetron (ZOFRAN) IV, oxyCODONE-acetaminophen, sodium chloride flush   Vital Signs   Vitals:   03/30/20 1940 03/31/20 0009 03/31/20 0249 03/31/20 0251  BP: 124/70 100/64 128/76   Pulse: 81 71 78   Resp: 17 18 20    Temp: 99.1 F (37.3 C) 98.8 F (37.1 C) 97.7 F (36.5 C)   TempSrc: Oral Oral Oral   SpO2: 98% 96% 100%   Weight:    118.3 kg  Height:        Intake/Output Summary (Last 24 hours) at 03/31/2020 0802 Last data filed at 03/31/2020 0251 Gross per 24 hour  Intake 480 ml  Output 875 ml  Net -395 ml   Last 3 Weights 03/31/2020 03/30/2020 03/29/2020  Weight (lbs) 260 lb 12.8 oz 259 lb 3.2 oz 259 lb 4.8 oz  Weight (kg) 118.298 kg 117.572 kg 117.618 kg      Telemetry  SR  ECG   Physical Exam   Vitals:   03/30/20 1940 03/31/20 0009 03/31/20 0249 03/31/20 0251  BP: 124/70 100/64 128/76   Pulse: 81 71 78   Resp: 17 18 20    Temp: 99.1 F (37.3 C) 98.8 F (37.1 C) 97.7 F (36.5 C)   TempSrc: Oral Oral Oral   SpO2: 98% 96% 100%   Weight:    118.3 kg  Height:         Intake/Output Summary (Last 24 hours) at 03/31/2020 0802 Last data filed at 03/31/2020 0251 Gross per 24 hour  Intake 480 ml  Output 875 ml  Net -395 ml    Last 3 Weights 03/31/2020 03/30/2020 03/29/2020  Weight (lbs) 260 lb 12.8 oz 259 lb 3.2 oz  259 lb 4.8 oz  Weight (kg) 118.298 kg 117.572 kg 117.618 kg    Body mass index is 36.37 kg/m.   General: Well nourished, well developed, in no acute distress Head: Atraumatic, normal size  Eyes: PEERLA, EOMI  Neck: , no JVD Cardiac: Normal S1, S2; RRR; no murmur Lungs: Clear to auscultation bilaterally, no wheezing, rhonchi or rales  Abd: Soft, nontender, no hepatomegaly  Ext: No edema, pulses 2+, R femoral cath site with knot, pulses good  Musculoskeletal: No deformities, BUE and BLE strength normal and equal Skin: Warm and dry, no rashes    Labs  High Sensitivity Troponin:   Recent Labs  Lab 03/26/20 2250 03/27/20 0056  TROPONINIHS 507* 513*     Cardiac EnzymesNo results for input(s): TROPONINI in the last 168 hours. No results for input(s): TROPIPOC in the last 168 hours.  Chemistry Recent Labs  Lab 03/26/20 2250 03/27/20 1800 03/29/20 0258 03/30/20 0359 03/31/20 0444  NA 139   < > 137 136 133*  K 3.7   < > 3.9 3.5 3.6  CL 102   < > 102 103  100  CO2 27   < > 25 25 22   GLUCOSE 104*   < > 146* 146* 147*  BUN 12   < > 13 16 13   CREATININE 1.21   < > 1.38* 1.43* 1.32*  CALCIUM 9.4   < > 8.9 8.5* 8.6*  PROT 7.8  --   --   --   --   ALBUMIN 4.1  --   --   --   --   AST 28  --   --   --   --   ALT 22  --   --   --   --   ALKPHOS 79  --   --   --   --   BILITOT 0.9  --   --   --   --   GFRNONAA >60   < > 56* 54* 59*  GFRAA >60   < > >60 >60 >60  ANIONGAP 10   < > 10 8 11    < > = values in this interval not displayed.    Hematology Recent Labs  Lab 03/29/20 0258 03/30/20 0359 03/31/20 0444  WBC 16.5* 12.1* 10.7*  RBC 4.74 4.37 4.14*  HGB 15.2 13.7 13.2  HCT 43.3 39.9 38.0*  MCV 91.4 91.3 91.8  MCH 32.1 31.4 31.9  MCHC 35.1 34.3 34.7  RDW 12.2 12.0 12.0  PLT 285 228 236   BNP Recent Labs  Lab 03/26/20 2252  BNP 42.0    DDimer No results for input(s): DDIMER in the last 168 hours.   Radiology  No results found.  Cardiac Studies  LHC  03/27/2020  CULPRIT lesion: Prox Cx to Mid Cx lesion is 99% stenosed with 99% stenosed side branch in 2nd Mrg.  A drug-eluting stent was successfully placed using a SYNERGY XD 3.50X24 -> postdilated to 4.1 mm  Post intervention, there is a 0% residual stenosis in the main LCx however the side branch was occluded-100% residual stenosis.  --------  LESION #2: Mid RCA to Dist RCA lesion is 99% stenosed.  A drug-eluting stent was successfully placed using a SYNERGY XD 2.50X16 -postdilated to 2.75 mm  Post intervention, there is a 0% residual stenosis.  --------  Tandem LAD lesions: Prox LAD lesion is 40% stenosed. Mid LAD-1 lesion is 65% stenosed. Mid LAD-2 lesion is 40% stenosed.  -----------------------------------------  There is mild left ventricular systolic dysfunction. The left ventricular ejection fraction is 45-50% by visual estimate.  LV end diastolic pressure is severely elevated prior to PCI, reduced to normal level of post PCI.   SEVERE TWO-VESSEL WITH MODERATE THIRD VESSEL CAD:  Long 95 to 99% proximal to mid LCx (culprit lesion) with TIMI I flow --> ? Successful DES PCI using Synergy DES 3.5 mm x 24 mm postdilated to 4.1 mm.  Additional 99% mid-distal RCA focal lesion (treated due to ongoing pain) ->  ? DES PCI using Synergy DES 2.5 mm 16 mm postdilated to 2.7 mm.  Tandem 40%, 60% and 40% lesions in the proximal to mid LAD on either side of first diagonal branch.  ? Plan for now to treat medically and assess after recovery from MI with two-vessel PCI.  Low normal to mildly reduced LVEF with initially severely elevated LVEDP that reduced after PCI and diuresis from 33 down to 12 mmHg.   --> Admitted to CCU for ongoing care.  Manual pressure for sheath removal.   TTE 03/28/2020 1. Left ventricular ejection fraction, by estimation, is 60 to 65%.  The  left ventricle has normal function. The left ventricle has no regional  wall motion abnormalities. There is mild  left ventricular hypertrophy.  Left ventricular diastolic parameters  were normal.  2. Right ventricular systolic function is normal. The right ventricular  size is normal.  3. The mitral valve is normal in structure. No evidence of mitral valve  regurgitation. No evidence of mitral stenosis.  4. The aortic valve is tricuspid. Aortic valve regurgitation is not  visualized. Mild aortic valve sclerosis is present, with no evidence of  aortic valve stenosis.  5. The inferior vena cava is normal in size with greater than 50%  respiratory variability, suggesting right atrial pressure of 3 mmHg.   Patient Profile  59 yo M with history of former tobacco abuse, HTN, HLD, obesity who was admitted 03/27/2020 with 3-4 days of worsening chest pain consistent with NSTEMI 03/27/2020. Progressed to STEMI.   Assessment & Plan   1. STEMI -initially admitted as NSTEMI. CP continued and developed inferior ST elevation prior to cath -s/p PCI  mLCX and mRCA. 60% pLAD lesion LVEF 60 to 65%   Elevated EDP at cath   -continue ASA, brilinta, metoprolol, statin  Follow up as outpt   WIll prbably need stress test before resuming driving   FOllow DOT forms   2. HLD -continue rosuvatstatin  3. HTN -metoprolol and lisinopril as above  FOllow BP at home     4. R femoral hematoma -small R femoral hematoma. Stable. No indication for intervention.    For questions or updates, please contact [place] Please consult [email or web address] for contact info under    Signed, [place] T. Audie Box, [place]  03/31/2020 8:02 AM

## 2020-03-31 NOTE — Discharge Summary (Signed)
Discharge Summary    Patient ID: James Cameron MRN: [id number]; DOB: 1961-03-07  Admit date: 03/26/2020 Discharge date: 03/31/2020  Primary Care Provider: Velna Hatchet, MD  Primary Cardiologist: Evalina Field, MD  Primary Electrophysiologist:  None   Discharge Diagnoses    Principal Problem:   Acute ST elevation myocardial infarction (STEMI) of inferior wall [facility]) Active Problems:   NSTEMI (non-ST elevated myocardial infarction) ([place])   Unstable angina ([place])   HLD (hyperlipidemia)   HTN (hypertension)   Obesity (BMI 30-39.9)    Diagnostic Studies/Procedures    LHC 03/27/2020  CULPRIT lesion: Prox Cx to Mid Cx lesion is 99% stenosed with 99% stenosed side branch in 2nd Mrg.  A drug-eluting stent was successfully placed using a SYNERGY XD 3.50X24 -> postdilated to 4.1 mm  Post intervention, there is a 0% residual stenosis in the main LCx however the side branch was occluded-100% residual stenosis.  --------  LESION #2: Mid RCA to Dist RCA lesion is 99% stenosed.  A drug-eluting stent was successfully placed using a SYNERGY XD 2.50X16 -postdilated to 2.75 mm  Post intervention, there is a 0% residual stenosis.  --------  Tandem LAD lesions: Prox LAD lesion is 40% stenosed. Mid LAD-1 lesion is 65% stenosed. Mid LAD-2 lesion is 40% stenosed.  -----------------------------------------  There is mild left ventricular systolic dysfunction. The left ventricular ejection fraction is 45-50% by visual estimate.  LV end diastolic pressure is severely elevated prior to PCI, reduced to normal level of post PCI.  SEVERE TWO-VESSEL WITH MODERATE THIRD VESSEL CAD:  Long 95 to 99% proximal to mid LCx (culprit lesion) with TIMI I flow --> ? Successful DES PCI using Synergy DES 3.5 mm x 24 mm postdilated to 4.1 mm.  Additional 99% mid-distal RCA focal lesion (treated due to ongoing pain) -> ? DES PCI using Synergy DES 2.5 mm 16 mm postdilated to 2.7 mm.  Tandem 40%, 60%  and 40% lesions in the proximal to mid LAD on either side of first diagonal branch.  ? Plan for now to treat medically and assess after recovery from MI with two-vessel PCI.  Low normal to mildly reduced LVEFwith initially severely elevated LVEDP that reduced after PCI and diuresis from 33 down to 12 mmHg.   -->Admitted to CCU for ongoing care. Manual pressure for sheath removal.   TTE 03/28/2020 1. Left ventricular ejection fraction, by estimation, is 60 to 65%. The  left ventricle has normal function. The left ventricle has no regional  wall motion abnormalities. There is mild left ventricular hypertrophy.  Left ventricular diastolic parameters  were normal.  2. Right ventricular systolic function is normal. The right ventricular  size is normal.  3. The mitral valve is normal in structure. No evidence of mitral valve  regurgitation. No evidence of mitral stenosis.  4. The aortic valve is tricuspid. Aortic valve regurgitation is not  visualized. Mild aortic valve sclerosis is present, with no evidence of  aortic valve stenosis.  5. The inferior vena cava is normal in size with greater than 50%  respiratory variability, suggesting right atrial pressure of 3 mmHg.  _____________   History of Present Illness     James Cameron is a 59 y.o. male with a hx of HTN, HLD, obesity, and past smoking, who presented with chest pain and DOE. Mr. Roarty does not currently follow with cardiology. He started noticing DOE last year. PCP obtained a calcium score 06/2019 which was 343 placing him at the 92nd percentile. He was also  noted to have aortic atherosclerosis. He states no additional follow up was planned. He has not seen pulmonology for his DOE. He reports intermittent chest discomfort since last year. On Sunday night, he suffered more severe right sided chest pain with radiation to his back and down his right arm. He was able to sit up with some pain relief. Pain felt like  something sitting on his chest and was rated as a9/10. Since then, he has continued to have intermittent chest pressure with exertion. He denies chest pain at rest and denies CP since being in the ER. CP has been associated with diaphoresis and SOB. No palpitations, lower extremity edema, orthopnea, or syncope. He is on losartan for HTN and low dose crestor. He is not diabetic. He is obese and does not exercise regularly.   He is married and has been taking care of his ill mother. He has a strong family history of heart disease on his father's side. Father died of MI. His older brother had PCI in his mid-50s. 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 He is a former smoker.    Hospital Course     Consultants: None   1. STEMI: patient presented with chest pain. Found to elevated troponins (peaked at 513). EKG initially with inferior TWI, though chest pain persisted and EKG revealed dynamic changes with STE in inferior leads. He was taken to the cath lab and found to have severe 2 vessel CAD in the LCx and RCA, both managed with PCI/DES, as well as moderate LAD disease medically managed. He was started on aspirin and brilinta for DAPT. Echo revealed EF 60-65%, no RWMA, mild LVH, normal LV diastolic function, and no significant valvular abnormalities. He had a LE artery study which did not reveal a pseudoaneurysm, though here was evidence of a right groin hematoma.  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  - Continue metoprolol tartrate 12.5mg  BID  2. HTN: BP stable this admission - Continue metoprolol tartrate  3. HLD: LDL 83 this admission. Crestor increased from  daily to  daily - Continue crestor - Repeat FLP/LFTs in 6-8 weeks for close monitoring.   4. Obesity: BMI 36 - Continue to encourage healthy dietary/lifestyle  modifications to promote weight loss.   Did the patient have an acute coronary syndrome (MI, NSTEMI, STEMI, etc) this admission?:  Yes                               AHA/ACC Clinical Performance & Quality Measures: 4. Aspirin prescribed? - Yes 5. ADP Receptor Inhibitor (Plavix/Clopidogrel, Brilinta/Ticagrelor or Effient/Prasugrel) prescribed (includes medically managed patients)? - Yes 6. Beta Blocker prescribed? - Yes 7. High Intensity Statin (Lipitor 40-80mg  or Crestor 20-40mg ) prescribed? - Yes 8. EF assessed during THIS hospitalization? - Yes 9. For EF <40%, was ACEI/ARB prescribed? - Not Applicable (EF >/= 40%) 10. For EF <40%, Aldosterone Antagonist (Spironolactone or Eplerenone) prescribed? - Not Applicable (EF >/= 40%) 11. Cardiac Rehab Phase II ordered (Included Medically managed Patients)? - Yes   _____________  Discharge Vitals Blood pressure 128/76, pulse 78, temperature 97.7 F (36.5 C), temperature source Oral, resp. rate 20, height  (1.803 m), weight 118.3 kg, SpO2 100 %.  Filed Weights   03/29/20 1618 03/30/20 0318 03/31/20 0251  Weight: 117.6 kg 117.6 kg 118.3 kg    Labs & Radiologic Studies    CBC Recent Labs    03/30/20 0359 03/31/20 0444  WBC 12.1* 10.7*  HGB 13.7 13.2  HCT 39.9 38.0*  MCV 91.3 91.8  PLT 228 236   Basic Metabolic Panel Recent Labs    40/98/1104/18/21 0359 03/31/20 0444  NA 136 133*  K 3.5 3.6  CL 103 100  CO2 25 22  GLUCOSE 146* 147*  BUN 16 13  CREATININE 1.43* 1.32*  CALCIUM 8.5* 8.6*   Liver Function Tests No results for input(s): AST, ALT, ALKPHOS, BILITOT, PROT, ALBUMIN in the last 72 hours. No results for input(s): LIPASE, AMYLASE in the last 72 hours. High Sensitivity Troponin:   Recent Labs  Lab 03/26/20 2250 03/27/20 0056  TROPONINIHS 507* 513*    BNP Invalid input(s): POCBNP D-Dimer No results for input(s): DDIMER in the last 72 hours. Hemoglobin A1C No results for input(s): HGBA1C in the last 72  hours. Fasting Lipid Panel No results for input(s): CHOL, HDL, LDLCALC, TRIG, CHOLHDL, LDLDIRECT in the last 72 hours. Thyroid Function Tests No results for input(s): TSH, T4TOTAL, T3FREE, THYROIDAB in the last 72 hours.  Invalid input(s): FREET3 _____________  DG Chest 2 View  Result Date: 03/26/2020 CLINICAL DATA:  Shortness of breath. EXAM: CHEST - 2 VIEW COMPARISON:  None. FINDINGS: The heart size and mediastinal contours are within normal limits. Both lungs are clear. No pneumothorax or pleural effusion is noted. The visualized skeletal structures are unremarkable. IMPRESSION: No active cardiopulmonary disease. Electronically Signed   By: Lupita RaiderJames  Green Jr M.D.   On: 03/26/2020 16:44   CARDIAC CATHETERIZATION  Result Date: 03/27/2020  CULPRIT lesion: Prox Cx to Mid Cx lesion is 99% stenosed with 99% stenosed side branch in 2nd Mrg.  A drug-eluting stent was successfully placed using a SYNERGY XD 3.50X24 -> postdilated to 4.1 mm  Post intervention, there is a 0% residual stenosis in the main LCx however the side branch was occluded-100% residual stenosis.  --------  LESION #2: Mid RCA to Dist RCA lesion is 99% stenosed.  A drug-eluting stent was successfully placed using a SYNERGY XD 2.50X16 -postdilated to 2.75 mm  Post intervention, there is a 0% residual stenosis.  --------  Tandem LAD lesions: Prox LAD lesion is 40% stenosed. Mid LAD-1 lesion is 65% stenosed. Mid LAD-2 lesion is 40% stenosed.  -----------------------------------------  There is mild left ventricular systolic dysfunction. The left ventricular ejection fraction is 45-50% by visual estimate.  LV end diastolic pressure is severely elevated prior to PCI, reduced to normal level of post PCI.  SEVERE TWO-VESSEL WITH MODERATE THIRD VESSEL CAD:  Long 95 to 99% proximal to mid LCx (culprit lesion) with TIMI I flow -->  Successful DES PCI using Synergy DES 3.5 mm x 24 mm postdilated to 4.1 mm.  Additional 99% mid-distal RCA  focal lesion (treated due to ongoing pain) ->  DES PCI using Synergy DES 2.5 mm 16 mm postdilated to 2.7 mm.  Tandem 40%, 60% and 40% lesions in the proximal to mid LAD on either side of first diagonal branch.  Plan for now to treat medically and assess after recovery from MI with two-vessel PCI.  Low normal to mildly reduced LVEF with initially severely elevated LVEDP that reduced after PCI and diuresis from 33 down to 12 mmHg. --> Admitted to CCU for ongoing care.  Manual pressure for sheath removal.  VAS US GROIN PSEUDOANEURYSM  Result Date: 03/28/2020  ARTERIAL PSEUDOANEURYSM  Exam: Right groin Indications: Patient complains of palpable knot. History: S/p catheterization. Comparison Study: No prior study Performing Technologist: Gertie Fey MHA, RDMS, RVT, RDCS  Examination Guidelines: A complete evaluation includes B-mode imaging, spectral Doppler, color Doppler, and power Doppler as needed of all accessible portions of each vessel. Bilateral testing is considered an integral part of a complete examination. Limited examinations for reoccurring indications may be performed as noted. +------------+----------+---------+------+----------+  Right Duplex PSV (cm/s) Waveform  Plaque Comment(s)  +------------+----------+---------+------+----------+  CFA             107     triphasic                    +------------+----------+---------+------+----------+  Prox SFA         97     triphasic                    +------------+----------+---------+------+----------+ Right Vein comments:CFV patent with no evidence of thrombus   Summary: No evidence of pseudoaneurysm, AVF or DVT  Findings:  A mixed echogenic structure measuring approximately 1.5 cm x 1.7 cm is visualized at the right groin with ultrasound characteristics of a hematoma. Diagnosing physician: Sherald Hess MD Electronically signed by Sherald Hess MD on 03/28/2020 at 3:59:01 PM.    --------------------------------------------------------------------------------    Final    ECHOCARDIOGRAM COMPLETE  Result Date: 03/28/2020    ECHOCARDIOGRAM REPORT   Patient Name:   LINDBERG Menger Date of Exam: 03/28/2020 Medical Rec #:  [id number]        Height:       71.0 in Accession #:    [id number]       Weight:       268.1 lb Date of Birth:  Sep 23, 1961         BSA:          2.388 m Patient Age:    59 years         BP:           139/69 mmHg Patient Gender: M                HR:           65 bpm. Exam Location:  Inpatient Procedure: 2D Echo and Intracardiac Opacification Agent Indications:    NSTEMI I21.4  History:        Patient has no prior history of Echocardiogram examinations.                 Angina and Acute MI; Risk Factors:Former Smoker, Hypertension                 and Dyslipidemia.  Sonographer:    Ross Ludwig RDCS (AE) Referring Phys: [id number] DAVID W HARDING IMPRESSIONS  1. Left ventricular ejection fraction, by estimation, is 60 to 65%. The left ventricle has normal function. The left ventricle has no regional wall motion abnormalities. There is mild left ventricular hypertrophy. Left ventricular diastolic parameters were normal.  2. Right ventricular systolic function is normal. The right ventricular size is normal.  3. The mitral valve is normal in structure. No evidence of mitral valve regurgitation. No evidence of mitral stenosis.  4. The aortic valve is tricuspid. Aortic valve regurgitation is not visualized. Mild aortic valve sclerosis is present, with no evidence of aortic valve stenosis.  5. The inferior vena cava is normal in size with greater than 50% respiratory variability, suggesting right atrial pressure of 3 mmHg. FINDINGS  Left Ventricle: Left ventricular ejection fraction, by estimation, is 60 to 65%. The left ventricle has normal function. The left ventricle has no regional wall motion abnormalities. Definity contrast agent was given IV to delineate the left ventricular   endocardial borders. The left ventricular internal cavity size was normal in size. There is mild left ventricular hypertrophy. Left ventricular diastolic parameters were normal. Right Ventricle: The right ventricular size is normal. No increase in right ventricular wall thickness. Right ventricular systolic function is normal. Left Atrium: Left atrial size was normal in size. Right Atrium: Right atrial size was normal in size. Pericardium: There is no evidence of pericardial effusion. Mitral Valve: The mitral valve is normal in structure. Normal mobility of the mitral valve leaflets. No evidence of mitral valve regurgitation. No evidence of mitral valve stenosis. Tricuspid Valve: The tricuspid valve is normal in structure. Tricuspid valve regurgitation is mild . No evidence of tricuspid stenosis. Aortic Valve: The aortic valve is tricuspid. Aortic valve regurgitation is not visualized. Mild aortic valve sclerosis is present, with no evidence of aortic valve stenosis. Aortic valve mean gradient measures 5.0 mmHg. Aortic valve peak gradient measures 9.7 mmHg. Aortic valve area, by VTI measures 2.89 cm. Pulmonic Valve: The pulmonic valve was normal in structure. Pulmonic valve regurgitation is not visualized. No evidence of pulmonic stenosis. Aorta: The aortic root is normal in size and structure. Venous: The inferior vena cava is normal in size with greater than 50% respiratory variability, suggesting right atrial pressure of 3 mmHg. IAS/Shunts: The interatrial septum was not well visualized.  LEFT VENTRICLE PLAX 2D LVIDd:         4.60 cm  Diastology LVIDs:         3.50 cm  LV e' lateral:   8.70 cm/s LV PW:         1.60 cm  LV E/e' lateral: 9.5 LV IVS:        1.30 cm  LV e' medial:    7.07 cm/s LVOT diam:     2.20 cm  LV E/e' medial:  11.7 LV SV:         80 LV SV Index:   34 LVOT Area:     3.80 cm  RIGHT VENTRICLE RV Basal diam:  3.40 cm RV S prime:     17.70 cm/s TAPSE (M-mode): 2.6 cm LEFT ATRIUM             Index        RIGHT ATRIUM           Index LA diam:        3.20 cm 1.34 cm/m  RA Area:     18.10 cm LA Vol (A2C):   61.1 ml 25.58 ml/m RA Volume:   51.40 ml  21.52 ml/m LA Vol (A4C):   86.5 ml 36.22 ml/m LA Biplane Vol: 76.4 ml 31.99 ml/m  AORTIC VALVE AV Area (Vmax):    2.85 cm AV Area (Vmean):   2.75 cm AV Area (VTI):     2.89 cm AV Vmax:           156.00 cm/s AV Vmean:          103.000 cm/s AV VTI:            0.278 m AV Peak Grad:      9.7 mmHg AV Mean Grad:      5.0 mmHg LVOT Vmax:         117.00 cm/s LVOT Vmean:  74.400 cm/s LVOT VTI:          0.211 m LVOT/AV VTI ratio: 0.76  AORTA Ao Root diam: 3.30 cm Ao Asc diam:  3.60 cm MITRAL VALVE MV Area (PHT): 3.99 cm    SHUNTS MV Decel Time: 190 msec    Systemic VTI:  0.21 m MV E velocity: 82.90 cm/s  Systemic Diam: 2.20 cm MV A velocity: 78.60 cm/s MV E/A ratio:  1.05 Charlton Haws MD Electronically signed by Charlton Haws MD Signature Date/Time: 03/28/2020/9:58:20 AM    Final    Disposition   Pt is being discharged home today in good condition.  Follow-up Plans & Appointments     Discharge Instructions    Amb Referral to Cardiac Rehabilitation   Complete by: As directed    Diagnosis:  STEMI Coronary Stents     After initial evaluation and assessments completed: Virtual Based Care may be provided alone or in conjunction with Phase 2 Cardiac Rehab based on patient barriers.: Yes      Discharge Medications   Allergies as of 03/31/2020      Reactions   Penicillins Rash      Medication List    STOP taking these medications   ibuprofen 800 MG tablet Commonly known as: ADVIL     TAKE these medications   aspirin 81 MG EC tablet Take 1 tablet (81 mg total) by mouth daily. Start taking on: April 01, 2020   metoprolol tartrate 25 MG tablet Commonly known as: LOPRESSOR Take 0.5 tablets (12.5 mg total) by mouth 2 (two) times daily.   nitroGLYCERIN 0.4 MG SL tablet Commonly known as: NITROSTAT Place 1 tablet (0.4 mg total) under  the tongue every 5 (five) minutes x 3 doses as needed for chest pain.   rosuvastatin 40 MG tablet Commonly known as: CRESTOR Take 1 tablet (40 mg total) by mouth daily. Start taking on: April 01, 2020 What changed:   medication strength  how much to take  when to take this   ticagrelor 90 MG Tabs tablet Commonly known as: BRILINTA Take 1 tablet (90 mg total) by mouth 2 (two) times daily.          Outstanding Labs/Studies   As above  Duration of Discharge Encounter   Greater than 30 minutes including physician time.  Signed, Beatriz Stallion, PA-C 03/31/2020, 1:08 PM

## 2020-03-31 NOTE — Discharge Instructions (Signed)

## 2020-03-31 NOTE — Telephone Encounter (Signed)
   Patient is scheduled for a TOC visit with Beatriz Stallion, PA-C 04/08/20 at 9:15am. Please place a TOC call to the patient.   Thank you!  Beatriz Stallion, PA-C 03/31/20; 1:06 PM

## 2020-03-31 NOTE — Progress Notes (Signed)
CARDIAC REHAB PHASE I   PRE:  Rate/Rhythm: 93 SR  BP:  Supine:   Sitting: 117/76  Standing:    SaO2: 100%RA  MODE:  Ambulation: 430 ft   POST:  Rate/Rhythm: 95  BP:  Supine:   Sitting: 142/87  Standing:    SaO2: 99%RA [id number] Pt c/o cramplike feeling at right knee area. Stated it felt like it needed heat. Pt walked 430 ft on RA with rolling walker with steady gait. Has walker he can use at home if needed. No CP. No questions re ed done on Saturday by Cardiac Rehab.  Leg felt a little better with walk. Pt stated it will feel better after walking and then cramp back with rest.   Luetta Nutting, RN BSN  03/31/2020 9:08 AM

## 2020-03-31 NOTE — TOC Progression Note (Addendum)
Transition of Care [facility]) - Progression Note    Patient Details  Name: James Cameron MRN: [id number] Date of Birth: August 09, 1961  [facility] CM/SW Contact  Zenon Mayo, RN Phone Number: 03/31/2020, 1:25 PM  Clinical Narrative:    Patient is for dc today, will be on brilinta. NCM confirmed that he has brilinat coupons and informed him that if his deductible has not been met then the copay for refills will be 35.00, he states he is ok with that.  TOC pharmacy will be filling the first 30 day free.         Expected Discharge Plan and Services           Expected Discharge Date: 03/31/20                                     Social Determinants of Health (SDOH) Interventions    Readmission Risk Interventions No flowsheet data found.

## 2020-03-31 NOTE — Care Management (Signed)
Per Estrella Deeds. W/[employer] [facility]: Co-pay amount for Brilinta 90 mg.bid for 30 day supply $35.00 at a retail pharmacy Mail order [employer] [facility] [employer] co-pay for  Brilinta 90mg . 90 day supply $70.00.  No PA required No deductible Tier ? [facility]

## 2020-04-01 NOTE — Telephone Encounter (Signed)
Patient contacted regarding discharge from [facility] on 04/01/19.  Patient understands to follow up with provider Judy Pimple on 04/09/19 at 9:15 am at [facility]. Patient understands discharge instructions? yes Patient understands medications and regiment? yes Patient understands to bring all medications to this visit? yes

## 2020-04-02 ENCOUNTER — Telehealth (HOSPITAL_COMMUNITY): Payer: Self-pay

## 2020-04-02 NOTE — Telephone Encounter (Signed)
Faxed referral to [facility] for Cardiac Rehab.  

## 2020-04-07 NOTE — Progress Notes (Signed)
Cardiology Office Note   Date:  04/09/2020   ID:  [employer], DOB 08/27/1961, MRN [id number]  PCP:  Alysia Penna, MD  Cardiologist:  Reatha Harps, MD EP: None  Chief Complaint  Patient presents with  . Hospitalization Follow-up    recent STEMI 03/27/20      History of Present Illness: James Cameron is a 59 y.o. male with PMH of CAD s/p STEMI 03/27/20 with PCI/DES to LCx and RCA, HTN, HLD, and obesity, who presents for post-hospital follow-up.  He was recently admitted to [facility] from 03/27/20-03/31/20 after presenting with chest pain with dynamic changes on serial EKGs with inferior STE's. Trop peaked at 513. He was taken to the cath lab and found to have severe 2 vessel CAD in the LCx and RCA, both managed with PCI/DES, as well as moderate LAD disease medically managed. He was started on aspirin and brilinta for DAPT. Echo revealed EF 60-65%, no RWMA, mild LVH, normal LV diastolic function, and no significant valvular abnormalities. He had a LE artery study which did not reveal a pseudoaneurysm, though here was evidence of a right groin hematoma.   He presents today for post-hospital follow-up. He has been doing well since discharge from the hospital. He is tolerating his medications without side effects. Reports compliance with his DAPT. He still has some decreased energy. He had his initial cardiac rehab encounter recently, though has concerns about the cost. No recurrent chest pain or SOB. He denies palpitations, dizziness, lightheadedness, or syncope.    Past Medical History:  Diagnosis Date  . Allergy    hx sinus infections  . Arthritis     Past Surgical History:  Procedure Laterality Date  . CORONARY/GRAFT ACUTE MI REVASCULARIZATION N/A 03/27/2020   Procedure: Coronary/Graft Acute MI Revascularization;  Surgeon: Marykay Lex, MD;  Location: [facility] INVASIVE CV LAB;  Service: Cardiovascular;  Laterality: N/A;  . LEFT HEART CATH AND CORONARY ANGIOGRAPHY N/A  03/27/2020   Procedure: LEFT HEART CATH AND CORONARY ANGIOGRAPHY;  Surgeon: Marykay Lex, MD;  Location: [facility] INVASIVE CV LAB;  Service: Cardiovascular;  Laterality: N/A;  . NECK SURGERY  2010   3 plates, 12 screws     Current Outpatient Medications  Medication Sig Dispense Refill  . aspirin EC 81 MG EC tablet Take 1 tablet (81 mg total) by mouth daily. 90 tablet 3  . metoprolol tartrate (LOPRESSOR) 25 MG tablet Take 0.5 tablets (12.5 mg total) by mouth 2 (two) times daily. 90 tablet 3  . nitroGLYCERIN (NITROSTAT) 0.4 MG SL tablet Place 1 tablet (0.4 mg total) under the tongue every 5 (five) minutes x 3 doses as needed for chest pain. 25 tablet 3  . rosuvastatin (CRESTOR) 40 MG tablet Take 1 tablet (40 mg total) by mouth daily. 90 tablet 3  . ticagrelor (BRILINTA) 90 MG TABS tablet Take 1 tablet (90 mg total) by mouth 2 (two) times daily. 180 tablet 3   No current facility-administered medications for this visit.    Allergies:   Penicillins    Social History:  The patient  reports that he has quit smoking. He has never used smokeless tobacco. He reports current alcohol use. He reports that he does not use drugs.   Family History:  The patient's family history includes Crohn's disease in his mother; Heart attack in his father; Heart disease in his brother.    ROS:  Please see the history of present illness.   Otherwise, review of systems are positive  for none.   All other systems are reviewed and negative.    PHYSICAL EXAM: VS:  BP 118/70   Pulse (!) 52   Temp 98.5 F (36.9 C) Comment: Forehead  Ht 5' 10.5" (1.791 m)   Wt 260 lb 6.4 oz (118.1 kg)   SpO2 99%   BMI 36.84 kg/m  , BMI Body mass index is 36.84 kg/m. GEN: Well nourished, well developed, in no acute distress HEENT: sclera anicteric Neck: no JVD, carotid bruits, or masses Cardiac: RRR; no murmurs, rubs, or gallops, no edema  Respiratory:  clear to auscultation bilaterally, normal work of breathing GI: soft,  obese, nontender, nondistended, + BS MS: no deformity or atrophy Skin: warm and dry, no rash Neuro:  Strength and sensation are intact Psych: euthymic mood, full affect   EKG:  EKG is ordered today. The ekg ordered today demonstrates sinus bradycardia, rate 52 bpm, no STE, non-specific T wave abnormalities, low voltage, no significant change from previous.    Recent Labs: 03/26/2020: ALT 22; B Natriuretic Peptide 42.0 03/28/2020: Magnesium 1.9 03/31/2020: BUN 13; Creatinine, Ser 1.32; Hemoglobin 13.2; Platelets 236; Potassium 3.6; Sodium 133    Lipid Panel    Component Value Date/Time   CHOL 137 03/27/2020 0056   TRIG 113 03/27/2020 0056   HDL 31 (L) 03/27/2020 0056   CHOLHDL 4.4 03/27/2020 0056   VLDL 23 03/27/2020 0056   LDLCALC 83 03/27/2020 0056      Wt Readings from Last 3 Encounters:  04/08/20 260 lb 6.4 oz (118.1 kg)  03/31/20 260 lb 12.8 oz (118.3 kg)  07/30/14 234 lb (106.1 kg)      Other studies Reviewed: Additional studies/ records that were reviewed today include:   LHC 03/27/2020  CULPRIT lesion: Prox Cx to Mid Cx lesion is 99% stenosed with 99% stenosed side branch in 2nd Mrg.  A drug-eluting stent was successfully placed using a SYNERGY XD 3.50X24 -> postdilated to 4.1 mm  Post intervention, there is a 0% residual stenosis in the main LCx however the side branch was occluded-100% residual stenosis.  --------  LESION #2: Mid RCA to Dist RCA lesion is 99% stenosed.  A drug-eluting stent was successfully placed using a SYNERGY XD 2.50X16 -postdilated to 2.75 mm  Post intervention, there is a 0% residual stenosis.  --------  Tandem LAD lesions: Prox LAD lesion is 40% stenosed. Mid LAD-1 lesion is 65% stenosed. Mid LAD-2 lesion is 40% stenosed.  -----------------------------------------  There is mild left ventricular systolic dysfunction. The left ventricular ejection fraction is 45-50% by visual estimate.  LV end diastolic pressure is severely  elevated prior to PCI, reduced to normal level of post PCI.  SEVERE TWO-VESSEL WITH MODERATE THIRD VESSEL CAD:  Long 95 to 99% proximal to mid LCx (culprit lesion) with TIMI I flow --> ? Successful DES PCI using Synergy DES 3.5 mm x 24 mm postdilated to 4.1 mm.  Additional 99% mid-distal RCA focal lesion (treated due to ongoing pain) -> ? DES PCI using Synergy DES 2.5 mm 16 mm postdilated to 2.7 mm.  Tandem 40%, 60% and 40% lesions in the proximal to mid LAD on either side of first diagonal branch.  ? Plan for now to treat medically and assess after recovery from MI with two-vessel PCI.  Low normal to mildly reduced LVEFwith initially severely elevated LVEDP that reduced after PCI and diuresis from 33 down to 12 mmHg.   -->Admitted to CCU for ongoing care. Manual pressure for sheath removal.  TTE 03/28/2020 1. Left ventricular ejection fraction, by estimation, is 60 to 65%. The  left ventricle has normal function. The left ventricle has no regional  wall motion abnormalities. There is mild left ventricular hypertrophy.  Left ventricular diastolic parameters  were normal.  2. Right ventricular systolic function is normal. The right ventricular  size is normal.  3. The mitral valve is normal in structure. No evidence of mitral valve  regurgitation. No evidence of mitral stenosis.  4. The aortic valve is tricuspid. Aortic valve regurgitation is not  visualized. Mild aortic valve sclerosis is present, with no evidence of  aortic valve stenosis.  5. The inferior vena cava is normal in size with greater than 50%  respiratory variability, suggesting right atrial pressure of 3 mmHg.  _____________       ASSESSMENT AND PLAN:  1. CAD s/p PCI/DES to LCx and RCA 03/27/20: also with moderate LAD disease medically managed. No anginal complaints since discharge. He has been compliant with his DAPT.  - Continue aspirin and brilinta - Continue statin - Continue metoprolol  tartrate  - May require stress testing prior to return to work as a [employer]. Will discuss with Dr. Flora Lipps   2. HTN: BP 118/70 today - Continue metoprolol tartrate 12.5mg  BID  3. HLD: LDL 83 03/27/20. Crestor increased from 5 to 40mg  daily in the hospital - Continue Crestor - Plan to repeat FLP/LFTs in 6 weeks for close monitoring  4. Obesity: BMI 36 - Continue to encourage healthy dietary/lifestyle modifications to promote weight loss.   5. Suspected OSA: He has complaints of snoring and daytime somnolence - Will order a sleep study to evaluate for sleep apnea    Current medicines are reviewed at length with the patient today.  The patient does not have concerns regarding medicines.  The following changes have been made:  As above  Labs/ tests ordered today include:   Orders Placed This Encounter  Procedures  . Hepatic function panel  . Lipid panel  . EKG 12-Lead  . Split night study     Disposition:   FU with Dr. in 1 month  Signed, Flora Lipps, PA-C  04/09/2020 1:29 AM

## 2020-04-08 ENCOUNTER — Other Ambulatory Visit: Payer: Self-pay

## 2020-04-08 ENCOUNTER — Ambulatory Visit: Payer: 59 | Admitting: Medical

## 2020-04-08 ENCOUNTER — Encounter: Payer: Self-pay | Admitting: Medical

## 2020-04-08 VITALS — BP 118/70 | HR 52 | Temp 98.5°F | Ht 70.5 in | Wt 260.4 lb

## 2020-04-08 DIAGNOSIS — R0683 Snoring: Secondary | ICD-10-CM

## 2020-04-08 DIAGNOSIS — E669 Obesity, unspecified: Secondary | ICD-10-CM

## 2020-04-08 DIAGNOSIS — I10 Essential (primary) hypertension: Secondary | ICD-10-CM | POA: Diagnosis not present

## 2020-04-08 DIAGNOSIS — I2510 Atherosclerotic heart disease of native coronary artery without angina pectoris: Secondary | ICD-10-CM

## 2020-04-08 DIAGNOSIS — E7800 Pure hypercholesterolemia, unspecified: Secondary | ICD-10-CM | POA: Diagnosis not present

## 2020-04-08 DIAGNOSIS — R400 Somnolence: Secondary | ICD-10-CM

## 2020-04-08 NOTE — Patient Instructions (Signed)
Medication Instructions:  Your physician recommends that you continue on your current medications as directed. Please refer to the Current Medication list given to you today.  *If you need a refill on your cardiac medications before your next appointment, please call your pharmacy*  Lab Work: Your physician recommends that you return for lab work in: 6 WEEKS - 05/20/2020   FASTING LIPID PANEL-DO NOT EAT OR DRINK PAST MIDNIGHT. (OKAY TO HAVE WATER)  HEPATIC (LIVER) FUNCTION  If you have labs (blood work) drawn today and your tests are completely normal, you will receive your results only by: Marland Kitchen MyChart Message (if you have MyChart) OR . A paper copy in the mail If you have any lab test that is abnormal or we need to change your treatment, we will call you to review the results.  Testing/Procedures: Your physician has recommended that you have a sleep study. This test records several body functions during sleep, including: brain activity, eye movement, oxygen and carbon dioxide blood levels, heart rate and rhythm, breathing rate and rhythm, the flow of air through your mouth and nose, snoring, body muscle movements, and chest and belly movement.   Please schedule within 1 month  Follow-Up: At [facility], you and your health needs are our priority.  As part of our continuing mission to provide you with exceptional heart care, we have created designated Provider Care Teams.  These Care Teams include your primary Cardiologist (physician) and Advanced Practice Providers (APPs -  Physician Assistants and Nurse Practitioners) who all work together to provide you with the care you need, when you need it.  We recommend signing up for the patient portal called "MyChart".  Sign up information is provided on this After Visit Summary.  MyChart is used to connect with patients for Virtual Visits (Telemedicine).  Patients are able to view lab/test results, encounter notes, upcoming appointments, etc.   Non-urgent messages can be sent to your provider as well.   To learn more about what you can do with MyChart, go to [email or web address].    Your next appointment:   1 month(s)  The format for your next appointment:   In Person  Provider:   Lennie Odor, MD  Other Instructions

## 2020-04-09 NOTE — Progress Notes (Signed)
No definite stuff on this. EF normal is probably the biggest barrier for most, luckily his is normal. I will probably stress him because of the residual LAD disease, but not before 4-6 weeks given his MI. I will wait till I see him to give him more time to get over his MI.   -Wes

## 2020-05-02 ENCOUNTER — Telehealth: Payer: Self-pay

## 2020-05-02 NOTE — Telephone Encounter (Signed)
Faxed clinicals to [facility] (Case [id number]) for Split Night Sleep study.

## 2020-05-05 NOTE — Progress Notes (Signed)
Cardiology Office Note:   Date:  05/08/2020  NAME:  James Cameron    MRN: [id number] DOB:  [birth year]   PCP:  Velna Hatchet, MD  Cardiologist:  Evalina Field, MD  Electrophysiologist:  None   Referring MD: Velna Hatchet, MD   Chief Complaint  Patient presents with  . Coronary Artery Disease   History of Present Illness:   James Cameron is a 59 y.o. male with a hx of CAD, HTN, HLD who presents for follow-up. Admitted 03/27/2020 with NSTEMI that progressed to STEMI.  He reports he is doing well since leaving the hospital.  He reports has had no episodes of chest pain like he had prior to his admission.  He reports he had a slight twinge in his chest last night that lasted seconds.  He described as a dull discomfort in his chest.  This did occur after eating.  He thinks it was gas.  He is doing cardiac rehabilitation.  He reports that he does get short of breath when he exerts himself but no chest pain.  He reports he started to improve his diet and he is eating at home more.  He does need a repeat lipid profile.  He is tolerating a statin medication.  He reports that he has bruising easily on aspirin ticagrelor.  Ticagrelor does cost quite a bit of money.  He is interested in possibly switching to a different agent.  He reports that he was denied a home sleep study by his insurance.  This is unfortunate.  He will have to continue with diet and exercise.  Blood pressures well controlled today.  Seems to be doing well.  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  I have recommended a Lexi nuclear medicine stress test to evaluate the mid LAD.  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  Problem List 1. CAD -NSTEMI progressed to STEMI 03/27/2020 -PCI to mid LCX, PCI to mid RCA -65% mid LAD 2. HTN 3. HLD -T chol 137, HDL 31, LDL 83, TG 113 -A1c 5.9  Past Medical History: Past Medical  History:  Diagnosis Date  . Allergy    hx sinus infections  . Arthritis   . Coronary artery disease   . Hyperlipidemia     Past Surgical History: Past Surgical History:  Procedure Laterality Date  . CARDIAC CATHETERIZATION    . CORONARY/GRAFT ACUTE MI REVASCULARIZATION N/A 03/27/2020   Procedure: Coronary/Graft Acute MI Revascularization;  Surgeon: Leonie Man, MD;  Location: [facility];  Service: Cardiovascular;  Laterality: N/A;  . LEFT HEART CATH AND CORONARY ANGIOGRAPHY N/A 03/27/2020   Procedure: LEFT HEART CATH AND CORONARY ANGIOGRAPHY;  Surgeon: Leonie Man, MD;  Location: [facility];  Service: Cardiovascular;  Laterality: N/A;  . NECK SURGERY  2010   3 plates, 12 screws    Current Medications: Current Meds  Medication Sig  . aspirin EC 81 MG EC tablet Take 1 tablet (81 mg total) by mouth daily.  . metoprolol tartrate (LOPRESSOR) 25 MG tablet Take 0.5 tablets (12.5 mg total) by mouth 2 (two) times daily.  . nitroGLYCERIN (NITROSTAT) 0.4 MG SL tablet Place 1 tablet (0.4 mg total) under the tongue every 5 (five) minutes x 3 doses as needed for chest pain.  . rosuvastatin (CRESTOR) 40 MG tablet Take 1 tablet (40 mg  total) by mouth daily.  . [DISCONTINUED] ticagrelor (BRILINTA) 90 MG TABS tablet Take 1 tablet (90 mg total) by mouth 2 (two) times daily.     Allergies:    Penicillins   Social History: Social History   Socioeconomic History  . Marital status: Married    Spouse name: Not on file  . Number of children: Not on file  . Years of education: Not on file  . Highest education level: Not on file  Occupational History  . Not on file  Tobacco Use  . Smoking status: Former Games developer  . Smokeless tobacco: Never Used  Substance and Sexual Activity  . Alcohol use: Yes    Comment: very rarely  . Drug use: No  . Sexual activity: Not on file  Other Topics Concern  . Not on file  Social History Narrative  . Not on file   Social Determinants  of Health   Financial Resource Strain:   . Difficulty of Paying Living Expenses:   Food Insecurity:   . Worried About Programme researcher, broadcasting/film/video in the Last Year:   . Barista in the Last Year:   Transportation Needs:   . Freight forwarder (Medical):   Marland Kitchen Lack of Transportation (Non-Medical):   Physical Activity:   . Days of Exercise per Week:   . Minutes of Exercise per Session:   Stress:   . Feeling of Stress :   Social Connections:   . Frequency of Communication with Friends and Family:   . Frequency of Social Gatherings with Friends and Family:   . Attends Religious Services:   . Active Member of Clubs or Organizations:   . Attends Banker Meetings:   Marland Kitchen Marital Status:      Family History: The patient's family history includes Crohn's disease in his mother; Heart attack in his father; Heart disease in his brother.  ROS:   All other ROS reviewed and negative. Pertinent positives noted in the HPI.     EKGs/Labs/Other Studies Reviewed:   The following studies were personally reviewed by me today:  LHC 03/27/2020  CULPRIT lesion: Prox Cx to Mid Cx lesion is 99% stenosed with 99% stenosed side branch in 2nd Mrg.  A drug-eluting stent was successfully placed using a SYNERGY XD 3.50X24 -> postdilated to 4.1 mm  Post intervention, there is a 0% residual stenosis in the main LCx however the side branch was occluded-100% residual stenosis.  --------  LESION #2: Mid RCA to Dist RCA lesion is 99% stenosed.  A drug-eluting stent was successfully placed using a SYNERGY XD 2.50X16 -postdilated to 2.75 mm  Post intervention, there is a 0% residual stenosis.  --------  Tandem LAD lesions: Prox LAD lesion is 40% stenosed. Mid LAD-1 lesion is 65% stenosed. Mid LAD-2 lesion is 40% stenosed.  -----------------------------------------  There is mild left ventricular systolic dysfunction. The left ventricular ejection fraction is 45-50% by visual estimate.  LV  end diastolic pressure is severely elevated prior to PCI, reduced to normal level of post PCI.   SEVERE TWO-VESSEL WITH MODERATE THIRD VESSEL CAD:  Long 95 to 99% proximal to mid LCx (culprit lesion) with TIMI I flow --> ? Successful DES PCI using Synergy DES 3.5 mm x 24 mm postdilated to 4.1 mm.  Additional 99% mid-distal RCA focal lesion (treated due to ongoing pain) ->  ? DES PCI using Synergy DES 2.5 mm 16 mm postdilated to 2.7 mm.  Tandem 40%, 60% and 40% lesions in  the proximal to mid LAD on either side of first diagonal branch.  ? Plan for now to treat medically and assess after recovery from MI with two-vessel PCI.  Low normal to mildly reduced LVEF with initially severely elevated LVEDP that reduced after PCI and diuresis from 33 down to 12 mmHg.  TTE 03/28/2020 1. Left ventricular ejection fraction, by estimation, is 60 to 65%. The  left ventricle has normal function. The left ventricle has no regional  wall motion abnormalities. There is mild left ventricular hypertrophy.  Left ventricular diastolic parameters  were normal.  2. Right ventricular systolic function is normal. The right ventricular  size is normal.  3. The mitral valve is normal in structure. No evidence of mitral valve  regurgitation. No evidence of mitral stenosis.  4. The aortic valve is tricuspid. Aortic valve regurgitation is not  visualized. Mild aortic valve sclerosis is present, with no evidence of  aortic valve stenosis.  5. The inferior vena cava is normal in size with greater than 50%  respiratory variability, suggesting right atrial pressure of 3 mmHg.   Recent Labs: 03/26/2020: ALT 22; B Natriuretic Peptide 42.0 03/28/2020: Magnesium 1.9 03/31/2020: BUN 13; Creatinine, Ser 1.32; Hemoglobin 13.2; Platelets 236; Potassium 3.6; Sodium 133   Recent Lipid Panel    Component Value Date/Time   CHOL 137 03/27/2020 0056   TRIG 113 03/27/2020 0056   HDL 31 (L) 03/27/2020 0056   CHOLHDL 4.4  03/27/2020 0056   VLDL 23 03/27/2020 0056   LDLCALC 83 03/27/2020 0056    Physical Exam:   VS:  BP 132/72   Pulse 62   Ht 5\' 11"  (1.803 m)   Wt 262 lb 3.2 oz (118.9 kg)   SpO2 100%   BMI 36.57 kg/m    Wt Readings from Last 3 Encounters:  05/08/20 262 lb 3.2 oz (118.9 kg)  04/08/20 260 lb 6.4 oz (118.1 kg)  03/31/20 260 lb 12.8 oz (118.3 kg)    General: Well nourished, well developed, in no acute distress Heart: Atraumatic, normal size  Eyes: PEERLA, EOMI  Neck: Supple, no JVD Endocrine: No thryomegaly Cardiac: Normal S1, S2; RRR; no murmurs, rubs, or gallops Lungs: Clear to auscultation bilaterally, no wheezing, rhonchi or rales  Abd: Soft, nontender, no hepatomegaly  Ext: No edema, pulses 2+ Musculoskeletal: No deformities, BUE and BLE strength normal and equal Skin: Warm and dry, no rashes   Neuro: Alert and oriented to person, place, time, and situation, CNII-XII grossly intact, no focal deficits  Psych: Normal mood and affect   ASSESSMENT:   James Cameron is a 59 y.o. male who presents for the following: 1. Coronary artery disease involving native coronary artery of native heart without angina pectoris   2. Essential hypertension   3. Pure hypercholesterolemia     PLAN:   1. Coronary artery disease involving native coronary artery of native heart without angina pectoris --NSTEMI progressed to STEMI 03/27/2020 -PCI to mid LCX, PCI to mid RCA -65% mid LAD -He will go ahead and complete his current prescription of ticagrelor.  He does report some increased bleeding.  The cost is also an issue.  We will switch him to Plavix 75 mg daily.  He will remain on aspirin as well.  He will remain on DAPT for 1 year.  He could pursue urgent surgery or urgent elective surgery after 6 months but I would recommend 1 year.  No colonoscopies or anything elective. -He describes no symptoms of angina.  He does  describe some occasional shortness of breath.  He does have a 65% mid LAD  lesion.  I recommend a Lexi nuclear medicine stress prior to return to work.  He is a Hydrographic surveyor.  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  -He will continue his metoprolol tartrate 12.5 mg twice daily.  He will do this for 3 years.  He was given prescription for sublingual nitroglycerin.  Continue Crestor 40 mg daily.  Repeat lipid profile today. -His ejection fraction is normal. -He is a Hydrographic surveyor.  Main barrier to the road is repeat stress test to assess mid LAD lesion.  He may need staged PCI.  We will see what the stress test shows.  This will have to be done at a tertiary office.  2. Essential hypertension -Well-controlled.  No change in medications.  3. Pure hypercholesterolemia -Continue Crestor 40 mg daily.  Repeat lipid profile today.  Goal cholesterol less than 70.   Disposition: Return in about 3 months (around 08/08/2020).  Medication Adjustments/Labs and Tests Ordered: Current medicines are reviewed at length with the patient today.  Concerns regarding medicines are outlined above.  Orders Placed This Encounter  Procedures  . Lipid panel  . MYOCARDIAL PERFUSION IMAGING   Meds ordered this encounter  Medications  . clopidogrel (PLAVIX) 75 MG tablet    Sig: Take 1 tablet (75 mg total) by mouth daily.    Dispense:  90 tablet    Refill:  3    Patient Instructions  Medication Instructions:  Finish your [place] Then start Plavix 75 mg daily  *If you need a refill on your cardiac medications before your next appointment, please call your pharmacy*   Lab Work: LIPID  Attached are the lab orders that are needed before your upcoming appointment, please come in anytime to have your labs drawn.   They are fasting labs, so nothing to eat or drink after midnight.  Lab hours: 8:00-4:00 lunch hours 12:45-1:45  If you have labs (blood work) drawn today and your tests are completely normal, you will  receive your results only by: Marland Kitchen MyChart Message (if you have MyChart) OR . A paper copy in the mail If you have any lab test that is abnormal or we need to change your treatment, we will call you to review the results.   Testing/Procedures: Your physician has requested that you have a lexiscan myoview (ASAP at [facility]) A cardiac stress test is a cardiological test that measures the heart's ability to respond to external stress in a controlled clinical environment. The stress response is induced by intravenous pharmacological stimulation.    Follow-Up: At [facility], you and your health needs are our priority.  As part of our continuing mission to provide you with exceptional heart care, we have created designated Provider Care Teams.  These Care Teams include your primary Cardiologist (physician) and Advanced Practice Providers (APPs -  Physician Assistants and Nurse Practitioners) who all work together to provide you with the care you need, when you need it.  We recommend signing up for the patient portal called "MyChart".  Sign up information is provided on this After Visit Summary.  MyChart is used to connect with patients for Virtual Visits (Telemedicine).  Patients are able to view lab/test results, encounter notes, upcoming appointments, etc.  Non-urgent messages can be sent to your provider as well.   To learn more about what you can do with  MyChart, go to [email or web address].    Your next appointment:   3 month(s)  The format for your next appointment:   In [email or web address]  Provider:   Lennie OdorWesley O'Neal, MD       Time Spent with Patient: I have spent a total of 35 minutes with patient reviewing hospital notes, telemetry, EKGs, labs and examining the patient as well as establishing an assessment and plan that was discussed with the patient.  > 50% of time was spent in direct patient care.  Signed, Lenna GilfordWesley T. Flora Lipps'Neal, MD [facility]  [facility]  [address] [place], [place] [address] [phone]  05/08/2020 2:06 PM

## 2020-05-08 ENCOUNTER — Telehealth (HOSPITAL_COMMUNITY): Payer: Self-pay | Admitting: *Deleted

## 2020-05-08 ENCOUNTER — Other Ambulatory Visit: Payer: Self-pay

## 2020-05-08 ENCOUNTER — Ambulatory Visit: Payer: 59 | Admitting: Cardiovascular Disease

## 2020-05-08 ENCOUNTER — Encounter: Payer: Self-pay | Admitting: Cardiovascular Disease

## 2020-05-08 VITALS — BP 132/72 | HR 62 | Ht 71.0 in | Wt 262.2 lb

## 2020-05-08 DIAGNOSIS — E7800 Pure hypercholesterolemia, unspecified: Secondary | ICD-10-CM

## 2020-05-08 DIAGNOSIS — I10 Essential (primary) hypertension: Secondary | ICD-10-CM

## 2020-05-08 DIAGNOSIS — I2510 Atherosclerotic heart disease of native coronary artery without angina pectoris: Secondary | ICD-10-CM

## 2020-05-08 LAB — LIPID PANEL
Chol/HDL Ratio: 2.9 ratio (ref 0.0–5.0)
Cholesterol, Total: 117 mg/dL (ref 100–199)
HDL: 41 mg/dL (ref >39)
LDL Chol Calc (NIH): 51 mg/dL (ref 0–99)
Triglycerides: 148 mg/dL (ref 0–149)
VLDL Cholesterol Cal: 25 mg/dL (ref 5–40)

## 2020-05-08 MED ORDER — CLOPIDOGREL BISULFATE 75 MG PO TABS: 75.0000 mg | ORAL_TABLET | Freq: Every day | ORAL | 3 refills | Status: DC

## 2020-05-08 NOTE — Patient Instructions (Addendum)
Medication Instructions:  Finish your [place] Then start Plavix 75 mg daily  *If you need a refill on your cardiac medications before your next appointment, please call your pharmacy*   Lab Work: LIPID  Attached are the lab orders that are needed before your upcoming appointment, please come in anytime to have your labs drawn.   They are fasting labs, so nothing to eat or drink after midnight.  Lab hours: 8:00-4:00 lunch hours 12:45-1:45  If you have labs (blood work) drawn today and your tests are completely normal, you will receive your results only by: Marland Kitchen MyChart Message (if you have MyChart) OR . A paper copy in the mail If you have any lab test that is abnormal or we need to change your treatment, we will call you to review the results.   Testing/Procedures: Your physician has requested that you have a lexiscan myoview (ASAP at [facility]) A cardiac stress test is a cardiological test that measures the heart's ability to respond to external stress in a controlled clinical environment. The stress response is induced by intravenous pharmacological stimulation.    Follow-Up: At [facility], you and your health needs are our priority.  As part of our continuing mission to provide you with exceptional heart care, we have created designated Provider Care Teams.  These Care Teams include your primary Cardiologist (physician) and Advanced Practice Providers (APPs -  Physician Assistants and Nurse Practitioners) who all work together to provide you with the care you need, when you need it.  We recommend signing up for the patient portal called "MyChart".  Sign up information is provided on this After Visit Summary.  MyChart is used to connect with patients for Virtual Visits (Telemedicine).  Patients are able to view lab/test results, encounter notes, upcoming appointments, etc.  Non-urgent messages can be sent to your provider as well.   To learn more about what you can do with  MyChart, go to [email or web address].    Your next appointment:   3 month(s)  The format for your next appointment:   In Person  Provider:   Lennie Odor, MD

## 2020-05-08 NOTE — Telephone Encounter (Signed)
Patient given detailed instructions per Myocardial Perfusion Study Information Sheet for the test on 05/16/20 at 8:00. Patient notified to arrive 15 minutes early and that it is imperative to arrive on time for appointment to keep from having the test rescheduled.  If you need to cancel or reschedule your appointment, please call the office within 24 hours of your appointment. . Patient verbalized understanding.James Cameron

## 2020-05-13 NOTE — Telephone Encounter (Signed)
Per fax received from [facility] request for split night sleep study was denied.  Precert is not required for in home study. Message to Claiborne Rigg..  Case Number [id number].

## 2020-05-16 ENCOUNTER — Ambulatory Visit (HOSPITAL_COMMUNITY): Payer: 59 | Attending: Cardiology

## 2020-05-16 ENCOUNTER — Other Ambulatory Visit: Payer: Self-pay

## 2020-05-16 DIAGNOSIS — I2510 Atherosclerotic heart disease of native coronary artery without angina pectoris: Secondary | ICD-10-CM | POA: Insufficient documentation

## 2020-05-16 LAB — MYOCARDIAL PERFUSION IMAGING
LV dias vol: 102 mL (ref 62–150)
LV sys vol: 53 mL
Peak HR: 80 {beats}/min
Rest HR: 48 {beats}/min
SDS: 8
SRS: 1
SSS: 9
TID: 1.08

## 2020-05-16 MED ORDER — TECHNETIUM TC 99M TETROFOSMIN IV KIT
10.2000 | PACK | Freq: Once | INTRAVENOUS | Status: AC | PRN
  Administered 2020-05-16: 10.2 via INTRAVENOUS
  Filled 2020-05-16: qty 11

## 2020-05-16 MED ORDER — TECHNETIUM TC 99M TETROFOSMIN IV KIT
31.9000 | PACK | Freq: Once | INTRAVENOUS | Status: AC | PRN
  Administered 2020-05-16: 31.9 via INTRAVENOUS
  Filled 2020-05-16: qty 32

## 2020-05-16 MED ORDER — REGADENOSON 0.4 MG/5ML IV SOLN
0.4000 mg | Freq: Once | INTRAVENOUS | Status: AC
  Administered 2020-05-16: 0.4 mg via INTRAVENOUS

## 2020-05-19 ENCOUNTER — Encounter (HOSPITAL_COMMUNITY): Payer: 59

## 2020-05-19 ENCOUNTER — Telehealth: Payer: Self-pay | Admitting: Cardiovascular Disease

## 2020-05-19 NOTE — Telephone Encounter (Signed)
New Message    Pt is calling about a return to work letter  He says he was suppose to go back to work today but cant without the letter.  Pt says he  tried reaching out through my chart last week.   Please advise

## 2020-05-19 NOTE — Telephone Encounter (Signed)
Called patient- advised that I had received the message from mychart- and I have sent it over to Dr.O'Neal to advise if okay and as soon as I got the okay from him to write it I would send it through mychart.  Patient verbalized understanding.

## 2020-05-19 NOTE — Telephone Encounter (Signed)
New message   Per Nettie Elm need a written order to participate in cardiac rehab. Please call to discuss

## 2020-05-19 NOTE — Telephone Encounter (Signed)
Attempted to contact cardiac rehab back-  Unable to reach anyone-  Will check Dr.O'Neal's box to see if the written order is in there, he is out of office this week, but will have him sign it next week.

## 2020-05-26 ENCOUNTER — Telehealth: Payer: Self-pay | Admitting: Cardiovascular Disease

## 2020-05-26 NOTE — Telephone Encounter (Signed)
Return call to pt. He voiced since having his stress test on Friday, he's been experiencing SOB. He denies swelling or increase in weight but voiced the SOB is constant. He denies CP, dizziness, or any other symptoms other than a head cold. He also is inquiring based on a heart perspective what MD feels is ok to take for his head cold.  Will route to MD and nurse for recommendations.

## 2020-05-26 NOTE — Telephone Encounter (Signed)
Pt updated and verbalized understanding. Appointment scheduled for Friday 6/18.

## 2020-05-26 NOTE — Telephone Encounter (Signed)
Can we get him into see me this week?  Gerri Spore T. Flora Lipps, MD [facility]  [address] [place], [place] [address] [phone]  3:36 PM

## 2020-05-26 NOTE — Telephone Encounter (Signed)
Pt c/o Shortness Of Breath: STAT if SOB developed within the last 24 hours or pt is noticeably SOB on the phone  1. Are you currently SOB (can you hear that pt is SOB on the phone)? no  2. How long have you been experiencing SOB? Since Friday after his stress test   3. Are you SOB when sitting or when up moving around? All the time   4. Are you currently experiencing any other symptoms? No   He also had a head cold, he wants to know what he could take.

## 2020-05-27 NOTE — Telephone Encounter (Signed)
Patient being seen in office 06/18

## 2020-05-28 NOTE — Progress Notes (Signed)
Cardiology Office Note:   Date:  05/30/2020  NAME:  James Cameron    MRN: [id number] DOB:  26-Jul-1961   PCP:  Alysia Penna, MD  Cardiologist:  Reatha Harps, MD  Electrophysiologist:  None   Referring MD: Alysia Penna, MD   Chief Complaint  Patient presents with  . Shortness of Breath   History of Present Illness:   James Cameron is a 59 y.o. male with a hx of CAD s/p NSTEMI 03/2020, HTN, HLD who presents for follow-up. Recent NSTEMI with PCI in April of 2021. Progressed to STEMI during that admission. Had residual disease in mid LAD that was normal on NM stres.  Reports since the stress test he had intermittent episodes of shortness of breath.  They can occur anytime.  He reports they can occur with exertion and they can also occur while he is talking.  He is still on ticagrelor.  We do have a plan to switch this week.  He had a recent refill of Brilinta when I saw him last month and he was going to switch to Plavix this month.  He has not made the switch yet.  I suspect most of his shortness of breath is related to ticagrelor.  His examination is without any volume overload.  His EKG shows sinus rhythm and an old inferior infarct which is unchanged from his prior EKG.  He reports he does get some aches and pains in his chest but this is not like the pain he had when he had his heart attack.  His stress test was normal.  His echo was normal.  He does have to wait 60 days from stent placement before he can go back to work.  He is working with a DOT physician.  He denies any chest pain or shortness of breath in office today.  He seems to be doing well.  He does report he is having some aches with 40 of Crestor.  He was on Lipitor in the past with some swelling in his legs.  He reports that he would like to drop the dose of Crestor as able.  We will plan to cut it in half to 20 mg.  His most recent LDL cholesterol is 51.  This is reassuring.  We do need to keep his LDL cholesterol less  than 70 but we will do this within reason on an acceptable dose of a statin agent.   His mother is now on hospice.  He reports she was diagnosed with pneumonia in the hospital.  He is going through this.  He reports it is quite stressful.  I do sympathize with him.  Problem List 1. CAD -NSTEMI progressed to STEMI 03/27/2020 -PCI to mid LCX, PCI to mid RCA -65% mid LAD; normal NM stress  2. HTN 3. HLD -Total cholesterol 117, HDL 41, LDL 51, triglycerides 148 -A1c 5.9  Past Medical History: Past Medical History:  Diagnosis Date  . Allergy    hx sinus infections  . Arthritis   . Coronary artery disease   . Hyperlipidemia     Past Surgical History: Past Surgical History:  Procedure Laterality Date  . CARDIAC CATHETERIZATION    . CORONARY/GRAFT ACUTE MI REVASCULARIZATION N/A 03/27/2020   Procedure: Coronary/Graft Acute MI Revascularization;  Surgeon: Marykay Lex, MD;  Location: [facility] INVASIVE CV LAB;  Service: Cardiovascular;  Laterality: N/A;  . LEFT HEART CATH AND CORONARY ANGIOGRAPHY N/A 03/27/2020   Procedure: LEFT HEART CATH AND CORONARY ANGIOGRAPHY;  Surgeon:  Marykay Lex, MD;  Location: [facility] INVASIVE CV LAB;  Service: Cardiovascular;  Laterality: N/A;  . NECK SURGERY  2010   3 plates, 12 screws    Current Medications: Current Meds  Medication Sig  . aspirin EC 81 MG EC tablet Take 1 tablet (81 mg total) by mouth daily.  . clopidogrel (PLAVIX) 75 MG tablet Take 1 tablet (75 mg total) by mouth daily.  . metoprolol tartrate (LOPRESSOR) 25 MG tablet Take 0.5 tablets (12.5 mg total) by mouth 2 (two) times daily.  . nitroGLYCERIN (NITROSTAT) 0.4 MG SL tablet Place 1 tablet (0.4 mg total) under the tongue every 5 (five) minutes x 3 doses as needed for chest pain.  . rosuvastatin (CRESTOR) 20 MG tablet Take 1 tablet (20 mg total) by mouth daily.  . [DISCONTINUED] rosuvastatin (CRESTOR) 40 MG tablet Take 1 tablet (40 mg total) by mouth daily.     Allergies:    Penicillins    Social History: Social History   Socioeconomic History  . Marital status: Married    Spouse name: Not on file  . Number of children: Not on file  . Years of education: Not on file  . Highest education level: Not on file  Occupational History  . Not on file  Tobacco Use  . Smoking status: Former Games developer  . Smokeless tobacco: Never Used  Substance and Sexual Activity  . Alcohol use: Yes    Comment: very rarely  . Drug use: No  . Sexual activity: Not on file  Other Topics Concern  . Not on file  Social History Narrative  . Not on file   Social Determinants of Health   Financial Resource Strain:   . Difficulty of Paying Living Expenses:   Food Insecurity:   . Worried About Programme researcher, broadcasting/film/video in the Last Year:   . Barista in the Last Year:   Transportation Needs:   . Freight forwarder (Medical):   Marland Kitchen Lack of Transportation (Non-Medical):   Physical Activity:   . Days of Exercise per Week:   . Minutes of Exercise per Session:   Stress:   . Feeling of Stress :   Social Connections:   . Frequency of Communication with Friends and Family:   . Frequency of Social Gatherings with Friends and Family:   . Attends Religious Services:   . Active Member of Clubs or Organizations:   . Attends Banker Meetings:   Marland Kitchen Marital Status:      Family History: The patient's family history includes Crohn's disease in his mother; Heart attack in his father; Heart disease in his brother.  ROS:   All other ROS reviewed and negative. Pertinent positives noted in the HPI.     EKGs/Labs/Other Studies Reviewed:   The following studies were personally reviewed by me today:  EKG:  EKG is ordered today.  The ekg ordered today demonstrates normal sinus rhythm, heart rate 61, no acute ST-T changes, inferior Q waves noted, and was personally reviewed by me.   LHC 03/27/2020  CULPRIT lesion: Prox Cx to Mid Cx lesion is 99% stenosed with 99% stenosed side branch in 2nd  Mrg.  A drug-eluting stent was successfully placed using a SYNERGY XD 3.50X24 -> postdilated to 4.1 mm  Post intervention, there is a 0% residual stenosis in the main LCx however the side branch was occluded-100% residual stenosis.  --------  LESION #2: Mid RCA to Dist RCA lesion is 99% stenosed.  A  drug-eluting stent was successfully placed using a SYNERGY XD 2.50X16 -postdilated to 2.75 mm  Post intervention, there is a 0% residual stenosis.  --------  Tandem LAD lesions: Prox LAD lesion is 40% stenosed. Mid LAD-1 lesion is 65% stenosed. Mid LAD-2 lesion is 40% stenosed.  -----------------------------------------  There is mild left ventricular systolic dysfunction. The left ventricular ejection fraction is 45-50% by visual estimate.  LV end diastolic pressure is severely elevated prior to PCI, reduced to normal level of post PCI.   SEVERE TWO-VESSEL WITH MODERATE THIRD VESSEL CAD:  Long 95 to 99% proximal to mid LCx (culprit lesion) with TIMI I flow --> ? Successful DES PCI using Synergy DES 3.5 mm x 24 mm postdilated to 4.1 mm.  Additional 99% mid-distal RCA focal lesion (treated due to ongoing pain) ->  ? DES PCI using Synergy DES 2.5 mm 16 mm postdilated to 2.7 mm.  Tandem 40%, 60% and 40% lesions in the proximal to mid LAD on either side of first diagonal branch.  ? Plan for now to treat medically and assess after recovery from MI with two-vessel PCI.  Low normal to mildly reduced LVEF with initially severely elevated LVEDP that reduced after PCI and diuresis from 33 down to 12 mmHg.  TTE 03/28/2020 1. Left ventricular ejection fraction, by estimation, is 60 to 65%. The  left ventricle has normal function. The left ventricle has no regional  wall motion abnormalities. There is mild left ventricular hypertrophy.  Left ventricular diastolic parameters  were normal.  2. Right ventricular systolic function is normal. The right ventricular  size is normal.  3. The  mitral valve is normal in structure. No evidence of mitral valve  regurgitation. No evidence of mitral stenosis.  4. The aortic valve is tricuspid. Aortic valve regurgitation is not  visualized. Mild aortic valve sclerosis is present, with no evidence of  aortic valve stenosis.  5. The inferior vena cava is normal in size with greater than 50%  respiratory variability, suggesting right atrial pressure of 3 mmHg.   NM 05/16/2020  Nuclear stress EF: 48%.  The left ventricular ejection fraction is mildly decreased (45-54%).  There was no ST segment deviation noted during stress.  The perfusion study is normal.  This is a low risk study.  Consider comparison of EF with 2D echo     Recent Labs: 03/26/2020: ALT 22; B Natriuretic Peptide 42.0 03/28/2020: Magnesium 1.9 03/31/2020: BUN 13; Creatinine, Ser 1.32; Hemoglobin 13.2; Platelets 236; Potassium 3.6; Sodium 133   Recent Lipid Panel    Component Value Date/Time   CHOL 117 05/08/2020 0946   TRIG 148 05/08/2020 0946   HDL 41 05/08/2020 0946   CHOLHDL 2.9 05/08/2020 0946   CHOLHDL 4.4 03/27/2020 0056   VLDL 23 03/27/2020 0056   LDLCALC 51 05/08/2020 0946    Physical Exam:   VS:  BP 120/80   Pulse 61   Ht 5\' 11"  (1.803 m)   Wt 258 lb (117 kg)   SpO2 95%   BMI 35.98 kg/m    Wt Readings from Last 3 Encounters:  05/30/20 258 lb (117 kg)  05/16/20 262 lb (118.8 kg)  05/08/20 262 lb 3.2 oz (118.9 kg)    General: Well nourished, well developed, in no acute distress Heart: Atraumatic, normal size  Eyes: PEERLA, EOMI  Neck: Supple, no JVD Endocrine: No thryomegaly Cardiac: Normal S1, S2; RRR; no murmurs, rubs, or gallops Lungs: Clear to auscultation bilaterally, no wheezing, rhonchi or rales  Abd: Soft,  nontender, no hepatomegaly  Ext: No edema, pulses 2+ Musculoskeletal: No deformities, BUE and BLE strength normal and equal Skin: Warm and dry, no rashes   Neuro: Alert and oriented to person, place, time, and situation,  CNII-XII grossly intact, no focal deficits  Psych: Normal mood and affect   ASSESSMENT:   James Cameron is a 59 y.o. male who presents for the following: 1. SOB (shortness of breath) on exertion   2. Coronary artery disease involving native coronary artery of native heart without angina pectoris   3. Essential hypertension   4. Pure hypercholesterolemia     PLAN:   1. SOB (shortness of breath) on exertion -Episodes of shortness of breath occur with exertion and at rest.  His EKG is unchanged with an old inferior infarct.  He has no evidence of volume overload on exam.  He has no stigmata of heart failure.  He is still on ticagrelor and he is switching this week.  He had a 30-day supply when I saw him a few weeks ago he was under complete this.  I have suspect his symptoms of shortness of breath are related to ticagrelor.  He will switch to Plavix this week.  He will let us know if his symptoms do not resolve after switching to Plavix.  I see no need for further testing as this is likely a medication side effect of ticagrelor.  Up to 15% of people with ticagrelor do experience a side effect.  2. Coronary artery disease involving native coronary artery of native heart without angina pectoris -Non-STEMI that progressed to STEMI on 03/27/2020.  PCI to mid left circumflex and mid RCA.  He has 65% lesion in the LAD.  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  That nuclear medicine stress test was normal.  His echocardiogram shows normal left ventricular function. -We will complete 1 year of aspirin and Plavix.  We will switch to Plavix as above. -Blood pressure is acceptable 120/80.  He is on metoprolol tartrate 12.5 mg twice daily. -Most recent LDL 51.  We will drop his Crestor to 20 mg.  He does report some cramping in his legs.  3. Essential hypertension -Acceptable today.  Continue metoprolol.  4. Pure  hypercholesterolemia -Crestor is going to be changed to 20 mg daily.  Most recent LDL 51.  He did have some cramping with 40 mg hopefully we get into acceptable dose and keep his LDL less than 70.   Disposition: Return in about 2 months (around 08/07/2020).  Medication Adjustments/Labs and Tests Ordered: Current medicines are reviewed at length with the patient today.  Concerns regarding medicines are outlined above.  Orders Placed This Encounter  Procedures  . EKG 12-Lead   Meds ordered this encounter  Medications  . rosuvastatin (CRESTOR) 20 MG tablet    Sig: Take 1 tablet (20 mg total) by mouth daily.    Dispense:  90 tablet    Refill:  1    Patient Instructions  Medication Instructions:  Decrease Crestor 20 mg daily   *If you need a refill on your cardiac medications before your next appointment, please call your pharmacy*   Follow-Up: At [facility], you and your health needs are our priority.  As part of our continuing mission to provide you with exceptional heart care, we have created designated Provider Care Teams.  These Care Teams include your primary Cardiologist (physician) and Advanced Practice Providers (APPs -  Physician Assistants and Nurse Practitioners) who all work together to provide you with the care you need, when you need it.  We recommend signing up for the patient portal called "MyChart".  Sign up information is provided on this After Visit Summary.  MyChart is used to connect with patients for Virtual Visits (Telemedicine).  Patients are able to view lab/test results, encounter notes, upcoming appointments, etc.  Non-urgent messages can be sent to your provider as well.   To learn more about what you can do with MyChart, go to [email or web address].    Your next appointment:   August 26th, 2021 at 4:20   The format for your next appointment:   In Person  Provider:   Eleonore Chiquito, MD        Time Spent with Patient: I have spent a total of  35 minutes with patient reviewing hospital notes, telemetry, EKGs, labs and examining the patient as well as establishing an assessment and plan that was discussed with the patient.  > 50% of time was spent in direct patient care.  Signed, Addison Naegeli. Audie Box, [place]  [address] [place], [place] [address] [phone]  05/30/2020 9:27 AM

## 2020-05-30 ENCOUNTER — Encounter: Payer: Self-pay | Admitting: Cardiovascular Disease

## 2020-05-30 ENCOUNTER — Telehealth: Payer: Self-pay | Admitting: *Deleted

## 2020-05-30 ENCOUNTER — Other Ambulatory Visit: Payer: Self-pay

## 2020-05-30 ENCOUNTER — Ambulatory Visit: Payer: 59 | Admitting: Cardiovascular Disease

## 2020-05-30 VITALS — BP 120/80 | HR 61 | Ht 71.0 in | Wt 258.0 lb

## 2020-05-30 DIAGNOSIS — I10 Essential (primary) hypertension: Secondary | ICD-10-CM

## 2020-05-30 DIAGNOSIS — E7800 Pure hypercholesterolemia, unspecified: Secondary | ICD-10-CM | POA: Diagnosis not present

## 2020-05-30 DIAGNOSIS — I2510 Atherosclerotic heart disease of native coronary artery without angina pectoris: Secondary | ICD-10-CM | POA: Diagnosis not present

## 2020-05-30 DIAGNOSIS — R0602 Shortness of breath: Secondary | ICD-10-CM | POA: Diagnosis not present

## 2020-05-30 MED ORDER — ROSUVASTATIN CALCIUM 20 MG PO TABS: 20.0000 mg | ORAL_TABLET | Freq: Every day | ORAL | 1 refills | Status: DC

## 2020-05-30 NOTE — Patient Instructions (Signed)
Medication Instructions:  Decrease Crestor 20 mg daily   *If you need a refill on your cardiac medications before your next appointment, please call your pharmacy*   Follow-Up: At [facility], you and your health needs are our priority.  As part of our continuing mission to provide you with exceptional heart care, we have created designated Provider Care Teams.  These Care Teams include your primary Cardiologist (physician) and Advanced Practice Providers (APPs -  Physician Assistants and Nurse Practitioners) who all work together to provide you with the care you need, when you need it.  We recommend signing up for the patient portal called "MyChart".  Sign up information is provided on this After Visit Summary.  MyChart is used to connect with patients for Virtual Visits (Telemedicine).  Patients are able to view lab/test results, encounter notes, upcoming appointments, etc.  Non-urgent messages can be sent to your provider as well.   To learn more about what you can do with MyChart, go to [email or web address].    Your next appointment:   August 26th, 2021 at 4:20   The format for your next appointment:   In Person  Provider:   Lennie Odor, MD

## 2020-05-30 NOTE — Telephone Encounter (Signed)
-----   Message from Dorris Fetch, CMA sent at 04/08/2020 12:25 PM EDT ----- Regarding: split night study Good morning,  Judy Pimple, PA-C saw this patient today and ordered for him to have a split night sleep study within 1 month.  Thanks,  Gaspar Cola

## 2020-06-05 ENCOUNTER — Other Ambulatory Visit: Payer: Self-pay | Admitting: Cardiovascular Disease

## 2020-06-05 DIAGNOSIS — R0683 Snoring: Secondary | ICD-10-CM

## 2020-06-05 DIAGNOSIS — R400 Somnolence: Secondary | ICD-10-CM

## 2020-06-05 DIAGNOSIS — E669 Obesity, unspecified: Secondary | ICD-10-CM

## 2020-06-06 ENCOUNTER — Telehealth: Payer: Self-pay | Admitting: *Deleted

## 2020-06-06 NOTE — Telephone Encounter (Signed)
MyChart message sent to patient informing him that his [employer] denied a in lab sleep study, therefore a HST has been scheduled.

## 2020-06-21 ENCOUNTER — Other Ambulatory Visit: Payer: Self-pay | Admitting: Medical

## 2020-06-27 ENCOUNTER — Encounter (HOSPITAL_BASED_OUTPATIENT_CLINIC_OR_DEPARTMENT_OTHER): Payer: 59 | Admitting: Cardiovascular Disease

## 2020-08-06 NOTE — Progress Notes (Signed)
Cardiology Office Note:   Date:  08/07/2020  NAME:  James Cameron    MRN: [id number] DOB:  [birth year]   PCP:  Alysia Penna, MD  Cardiologist:  Reatha Harps, MD   Referring MD: Alysia Penna, MD   Chief Complaint  Patient presents with  . Follow-up    History of Present Illness:   James Cameron is a 59 y.o. male with a hx of CAD, HTN, HLD who presents for follow-up of CAD. Had SOB and we switched to plavix from ticagrelor.  He reports his shortness of breath is improved.  Clearly Brilinta was the problem.  Weights are stable.  He reports some cramping on Crestor 20 mg daily.  He does request to go back on 5 mg daily.  His most recent LDL is at goal.  We did discuss going on Crestor 5 mg daily as well as Zetia.  I think this will be a good option for him.  His blood pressure is 122/62 today.  He denies any chest pain or shortness of breath.  He is not exercising but has no limitations with his current level of activity.  Overall doing quite well.  Problem List 1. CAD -NSTEMI progressed to STEMI 03/27/2020 -PCI to mid LCX, PCI to mid RCA -65% mid LAD; normal NM stress  2. HTN 3. HLD -Total cholesterol 117, HDL 41, LDL 51, triglycerides 148 -A1c 5.9  Past Medical History: Past Medical History:  Diagnosis Date  . Allergy    hx sinus infections  . Arthritis   . Coronary artery disease   . Hyperlipidemia     Past Surgical History: Past Surgical History:  Procedure Laterality Date  . CARDIAC CATHETERIZATION    . CORONARY/GRAFT ACUTE MI REVASCULARIZATION N/A 03/27/2020   Procedure: Coronary/Graft Acute MI Revascularization;  Surgeon: Marykay Lex, MD;  Location: [facility] INVASIVE CV LAB;  Service: Cardiovascular;  Laterality: N/A;  . LEFT HEART CATH AND CORONARY ANGIOGRAPHY N/A 03/27/2020   Procedure: LEFT HEART CATH AND CORONARY ANGIOGRAPHY;  Surgeon: Marykay Lex, MD;  Location: [facility] INVASIVE CV LAB;  Service: Cardiovascular;  Laterality: N/A;  . NECK SURGERY  2010    3 plates, 12 screws    Current Medications: Current Meds  Medication Sig  . aspirin EC 81 MG EC tablet Take 1 tablet (81 mg total) by mouth daily.  . clopidogrel (PLAVIX) 75 MG tablet Take 1 tablet (75 mg total) by mouth daily.  . Multiple Vitamin (MULTIVITAMIN) tablet Take 1 tablet by mouth daily.  . nitroGLYCERIN (NITROSTAT) 0.4 MG SL tablet PLACE 1 TAB UNDER TONGUE EVERY 5 MINUTES 3 DOSES AS NEEDED FOR CHEST PAIN  . rosuvastatin (CRESTOR) 5 MG tablet Take 1 tablet (5 mg total) by mouth daily.  . [DISCONTINUED] clopidogrel (PLAVIX) 75 MG tablet Take 1 tablet (75 mg total) by mouth daily.  . [DISCONTINUED] rosuvastatin (CRESTOR) 20 MG tablet Take 1 tablet (20 mg total) by mouth daily.     Allergies:    Penicillins   Social History: Social History   Socioeconomic History  . Marital status: Married    Spouse name: Not on file  . Number of children: Not on file  . Years of education: Not on file  . Highest education level: Not on file  Occupational History  . Not on file  Tobacco Use  . Smoking status: Former Games developer  . Smokeless tobacco: Never Used  Substance and Sexual Activity  . Alcohol use: Yes    Comment: very rarely  .  Drug use: No  . Sexual activity: Not on file  Other Topics Concern  . Not on file  Social History Narrative  . Not on file   Social Determinants of Health   Financial Resource Strain:   . Difficulty of Paying Living Expenses: Not on file  Food Insecurity:   . Worried About Programme researcher, broadcasting/film/video in the Last Year: Not on file  . Ran Out of Food in the Last Year: Not on file  Transportation Needs:   . Lack of Transportation (Medical): Not on file  . Lack of Transportation (Non-Medical): Not on file  Physical Activity:   . Days of Exercise per Week: Not on file  . Minutes of Exercise per Session: Not on file  Stress:   . Feeling of Stress : Not on file  Social Connections:   . Frequency of Communication with Friends and Family: Not on file  .  Frequency of Social Gatherings with Friends and Family: Not on file  . Attends Religious Services: Not on file  . Active Member of Clubs or Organizations: Not on file  . Attends Banker Meetings: Not on file  . Marital Status: Not on file     Family History: The patient's family history includes Crohn's disease in his mother; Heart attack in his father; Heart disease in his brother.  ROS:   All other ROS reviewed and negative. Pertinent positives noted in the HPI.     EKGs/Labs/Other Studies Reviewed:   The following studies were personally reviewed by me today:  NM Stress 05/16/2020  Nuclear stress EF: 48%.  The left ventricular ejection fraction is mildly decreased (45-54%).  There was no ST segment deviation noted during stress.  The perfusion study is normal.  This is a low risk study.  Consider comparison of EF with 2D echo  TTE 03/28/2020 1. Left ventricular ejection fraction, by estimation, is 60 to 65%. The  left ventricle has normal function. The left ventricle has no regional  wall motion abnormalities. There is mild left ventricular hypertrophy.  Left ventricular diastolic parameters  were normal.  2. Right ventricular systolic function is normal. The right ventricular  size is normal.  3. The mitral valve is normal in structure. No evidence of mitral valve  regurgitation. No evidence of mitral stenosis.  4. The aortic valve is tricuspid. Aortic valve regurgitation is not  visualized. Mild aortic valve sclerosis is present, with no evidence of  aortic valve stenosis.  5. The inferior vena cava is normal in size with greater than 50%  respiratory variability, suggesting right atrial pressure of 3 mmHg.   Recent Labs: 03/26/2020: ALT 22; B Natriuretic Peptide 42.0 03/28/2020: Magnesium 1.9 03/31/2020: BUN 13; Creatinine, Ser 1.32; Hemoglobin 13.2; Platelets 236; Potassium 3.6; Sodium 133   Recent Lipid Panel    Component Value Date/Time    CHOL 117 05/08/2020 0946   TRIG 148 05/08/2020 0946   HDL 41 05/08/2020 0946   CHOLHDL 2.9 05/08/2020 0946   CHOLHDL 4.4 03/27/2020 0056   VLDL 23 03/27/2020 0056   LDLCALC 51 05/08/2020 0946    Physical Exam:   VS:  BP 122/62 (BP Location: Right Arm, Patient Position: Sitting, Cuff Size: Large)   Pulse 79   Ht 5\' 11"  (1.803 m)   Wt 260 lb (117.9 kg)   SpO2 97%   BMI 36.26 kg/m    Wt Readings from Last 3 Encounters:  08/07/20 260 lb (117.9 kg)  05/30/20 258 lb (117 kg)  05/16/20 262 lb (118.8 kg)    General: Well nourished, well developed, in no acute distress Heart: Atraumatic, normal size  Eyes: PEERLA, EOMI  Neck: Supple, no JVD Endocrine: No thryomegaly Cardiac: Normal S1, S2; RRR; no murmurs, rubs, or gallops Lungs: Clear to auscultation bilaterally, no wheezing, rhonchi or rales  Abd: Soft, nontender, no hepatomegaly  Ext: No edema, pulses 2+ Musculoskeletal: No deformities, BUE and BLE strength normal and equal Skin: Warm and dry, no rashes   Neuro: Alert and oriented to person, place, time, and situation, CNII-XII grossly intact, no focal deficits  Psych: Normal mood and affect   ASSESSMENT:   James Cameron is a 59 y.o. male who presents for the following: 1. Coronary artery disease involving native coronary artery of native heart without angina pectoris   2. Essential hypertension   3. Mixed hyperlipidemia     PLAN:   1. Coronary artery disease involving native coronary artery of native heart without angina pectoris -Non-STEMI that progressed to STEMI in April 2021.  Drug-eluting stent to circumflex as well as RCA.  He did have residual disease in LAD.  We did a nuclear medicine stress test that was normal.  No further chest pain episodes.  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 -Continue aspirin and Plavix.  Had some dyspnea with Brilinta.  He will complete 1 year of DAPT. -We will drop his Crestor to 5 mg daily.  I will add Zetia 10 mg  daily.  He apparently had cramping on Crestor 20 mg daily.  2. Essential hypertension -Blood pressure well controlled.  Continue metoprolol.  3. Mixed hyperlipidemia -Decrease Crestor to 5 mg daily.  Add Zetia 10 mg daily.  Disposition: Return in about 6 months (around 02/07/2021).  Medication Adjustments/Labs and Tests Ordered: Current medicines are reviewed at length with the patient today.  Concerns regarding medicines are outlined above.  No orders of the defined types were placed in this encounter.  Meds ordered this encounter  Medications  . rosuvastatin (CRESTOR) 5 MG tablet    Sig: Take 1 tablet (5 mg total) by mouth daily.    Dispense:  90 tablet    Refill:  1  . ezetimibe (ZETIA) 10 MG tablet    Sig: Take 1 tablet (10 mg total) by mouth daily.    Dispense:  90 tablet    Refill:  3  . metoprolol tartrate (LOPRESSOR) 25 MG tablet    Sig: Take 0.5 tablets (12.5 mg total) by mouth 2 (two) times daily.    Dispense:  90 tablet    Refill:  3  . clopidogrel (PLAVIX) 75 MG tablet    Sig: Take 1 tablet (75 mg total) by mouth daily.    Dispense:  90 tablet    Refill:  3    Patient Instructions  Medication Instructions:  Decrease 5 mg daily  Start Zetia 10 mg daily   *If you need a refill on your cardiac medications before your next appointment, please call your pharmacy*  Follow-Up: At [facility], you and your health needs are our priority.  As part of our continuing mission to provide you with exceptional heart care, we have created designated Provider Care Teams.  These Care Teams include your primary Cardiologist (physician) and Advanced Practice Providers (APPs -  Physician Assistants and Nurse Practitioners) who all work together to provide you with the care you need, when you need it.  We recommend signing up for the patient portal called "  MyChart".  Sign up information is provided on this After Visit Summary.  MyChart is used to connect with patients for Virtual  Visits (Telemedicine).  Patients are able to view lab/test results, encounter notes, upcoming appointments, etc.  Non-urgent messages can be sent to your provider as well.   To learn more about what you can do with MyChart, go to [email or web address].    Your next appointment:   6 month(s)  The format for your next appointment:   In Person  Provider:   Lennie Odor, MD       Time Spent with Patient: I have spent a total of 35 minutes with patient reviewing hospital notes, telemetry, EKGs, labs and examining the patient as well as establishing an assessment and plan that was discussed with the patient.  > 50% of time was spent in direct patient care.  Signed, Lenna Gilford. Flora Lipps, MD [facility]  [address], [address] [place], [place] [address] [phone]  08/07/2020 4:35 PM

## 2020-08-07 ENCOUNTER — Other Ambulatory Visit: Payer: Self-pay

## 2020-08-07 ENCOUNTER — Ambulatory Visit: Payer: 59 | Admitting: Cardiovascular Disease

## 2020-08-07 ENCOUNTER — Encounter: Payer: Self-pay | Admitting: Cardiovascular Disease

## 2020-08-07 VITALS — BP 122/62 | HR 79 | Ht 71.0 in | Wt 260.0 lb

## 2020-08-07 DIAGNOSIS — E782 Mixed hyperlipidemia: Secondary | ICD-10-CM

## 2020-08-07 DIAGNOSIS — I10 Essential (primary) hypertension: Secondary | ICD-10-CM | POA: Diagnosis not present

## 2020-08-07 DIAGNOSIS — I2510 Atherosclerotic heart disease of native coronary artery without angina pectoris: Secondary | ICD-10-CM

## 2020-08-07 MED ORDER — METOPROLOL TARTRATE 25 MG PO TABS: 12.5000 mg | ORAL_TABLET | Freq: Two times a day (BID) | ORAL | 3 refills | Status: DC

## 2020-08-07 MED ORDER — EZETIMIBE 10 MG PO TABS: 10.0000 mg | ORAL_TABLET | Freq: Every day | ORAL | 3 refills | Status: DC

## 2020-08-07 MED ORDER — CLOPIDOGREL BISULFATE 75 MG PO TABS
75.0000 mg | ORAL_TABLET | Freq: Every day | ORAL | 3 refills | Status: DC
Start: 2020-08-07 — End: 2021-02-24

## 2020-08-07 MED ORDER — ROSUVASTATIN CALCIUM 5 MG PO TABS: 5.0000 mg | ORAL_TABLET | Freq: Every day | ORAL | 1 refills | Status: DC

## 2020-08-07 NOTE — Patient Instructions (Signed)
Medication Instructions:  Decrease 5 mg daily  Start Zetia 10 mg daily   *If you need a refill on your cardiac medications before your next appointment, please call your pharmacy*  Follow-Up: At [facility], you and your health needs are our priority.  As part of our continuing mission to provide you with exceptional heart care, we have created designated Provider Care Teams.  These Care Teams include your primary Cardiologist (physician) and Advanced Practice Providers (APPs -  Physician Assistants and Nurse Practitioners) who all work together to provide you with the care you need, when you need it.  We recommend signing up for the patient portal called "MyChart".  Sign up information is provided on this After Visit Summary.  MyChart is used to connect with patients for Virtual Visits (Telemedicine).  Patients are able to view lab/test results, encounter notes, upcoming appointments, etc.  Non-urgent messages can be sent to your provider as well.   To learn more about what you can do with MyChart, go to [email or web address].    Your next appointment:   6 month(s)  The format for your next appointment:   In Person  Provider:   Lennie Odor, MD

## 2020-08-16 IMAGING — CT CT HEART SCORING
3 series · 14 of 20 positions shown, 16 images · non-contrast
Comparison: No priors.

CLINICAL DATA: 58-year-old Caucasian male with shortness of breath
and family history of coronary artery disease.

EXAM:
CT HEART FOR CALCIUM SCORING
TECHNIQUE: CT heart was performed using prospective ECG gating.
A non-contrast exam for calcium scoring was performed.
Note that this exam targets the heart and the chest was not imaged
in its entirety.

[Series 2: calcium scoring 2.00 qr36 bestdiast 69% · axial · 0.47mm/px · z∈[+1573,+1681]mm · 4 of 90 slices shown]
[im 18/90  vessel]
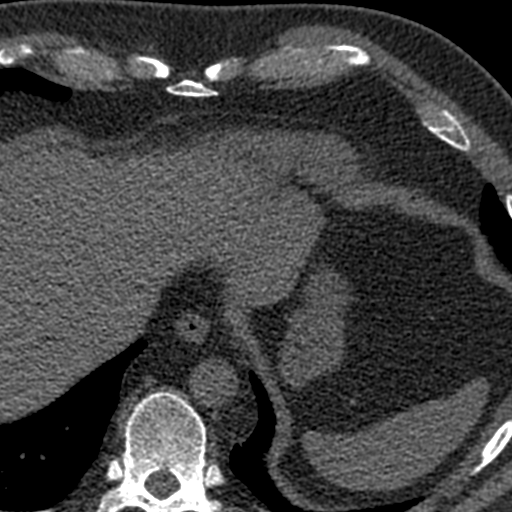
[im 36/90  vessel]
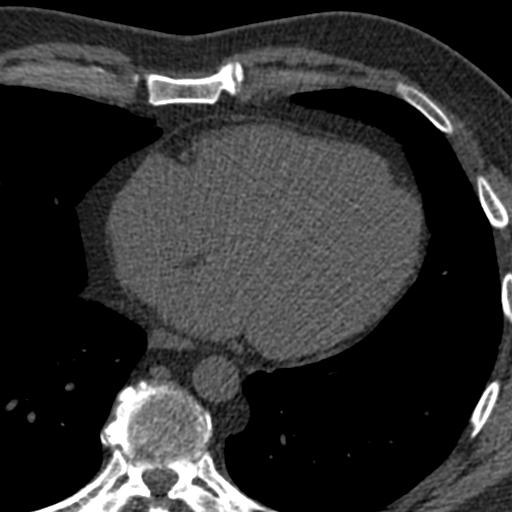
[im 54/90  vessel]
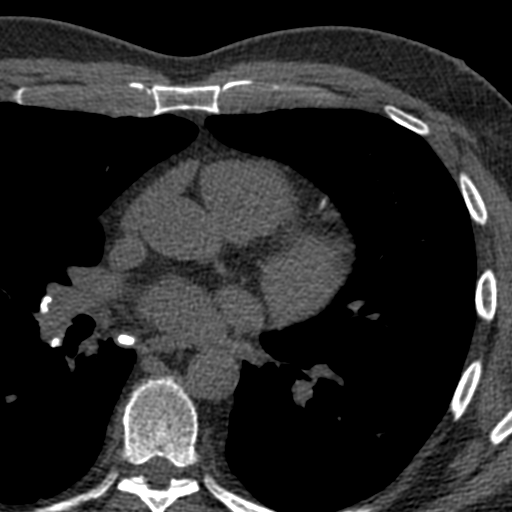
[im 72/90  vessel]
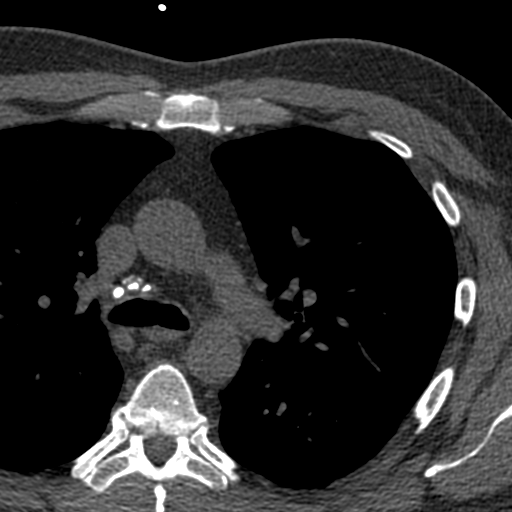

[Series 3: calcium scoring 2.00 br40 bestdiast 69% fov · axial · 0.70mm/px · z∈[+1569,+1687]mm · 5 of 89 slices shown, 7 images]
[im 15/89  vessel]
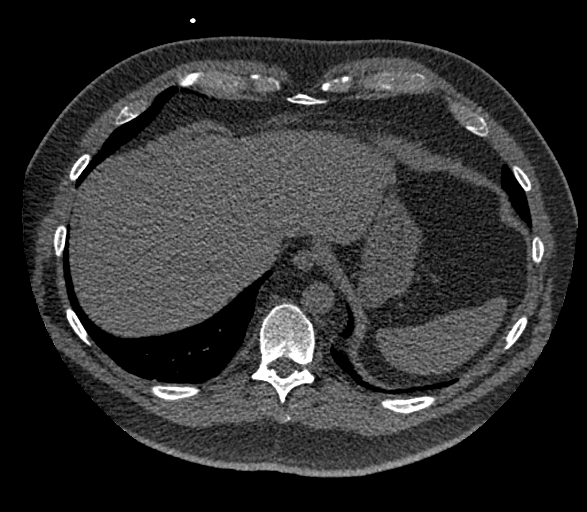
[im 15/89  lung]
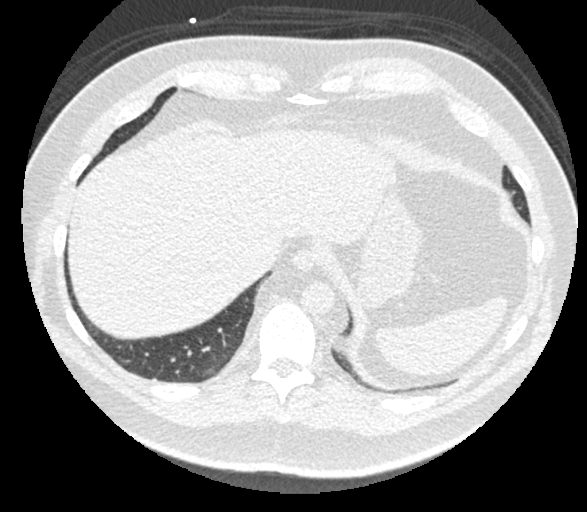
[im 30/89  vessel]
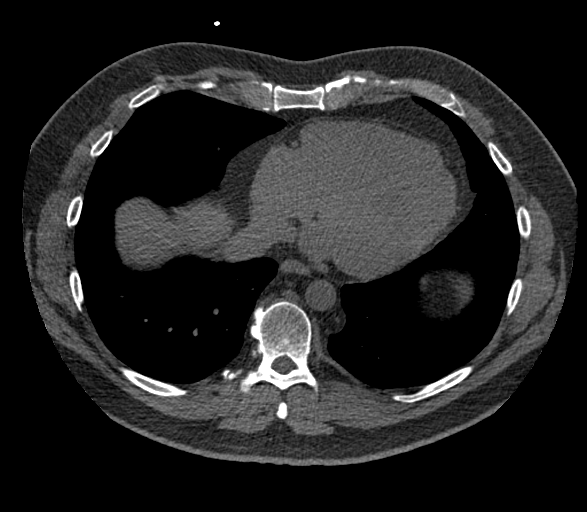
[im 45/89  vessel]
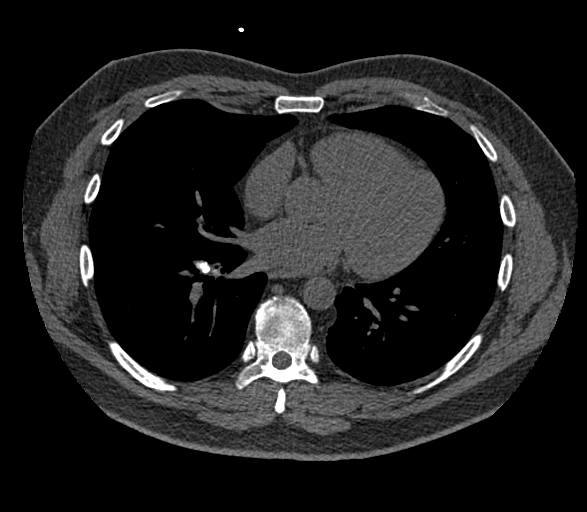
[im 59/89  vessel]
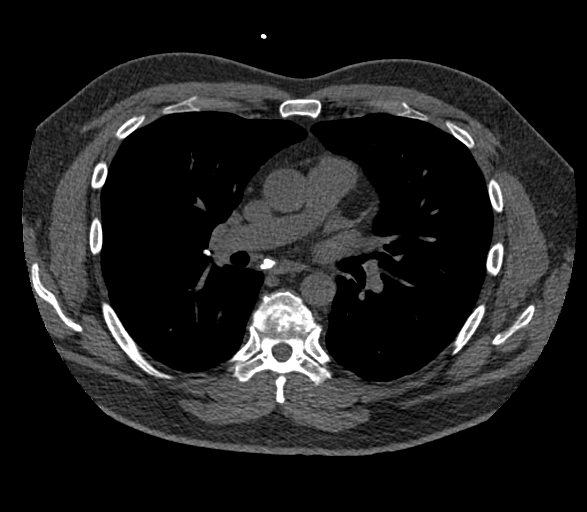
[im 74/89  vessel]
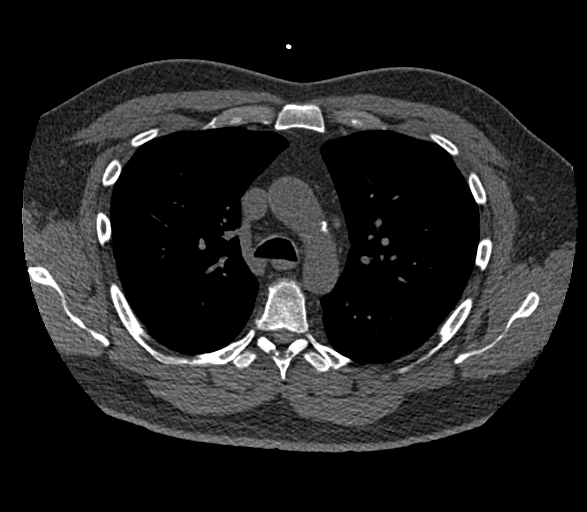
[im 74/89  lung]
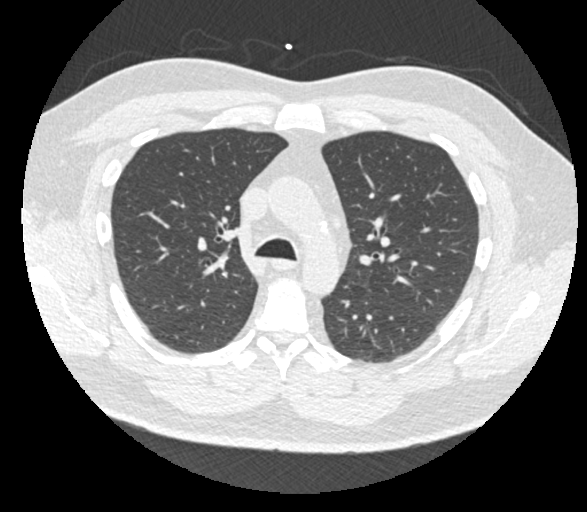

[Series 9: calcium scoring 2.00 br60 bestdiast 69% fov · axial · 0.70mm/px · z∈[+1569,+1687]mm · 5 of 89 slices shown]
[im 15/89  vessel]
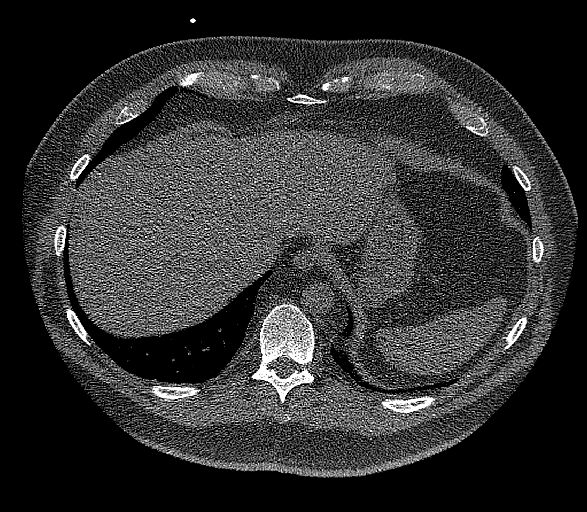
[im 30/89  vessel]
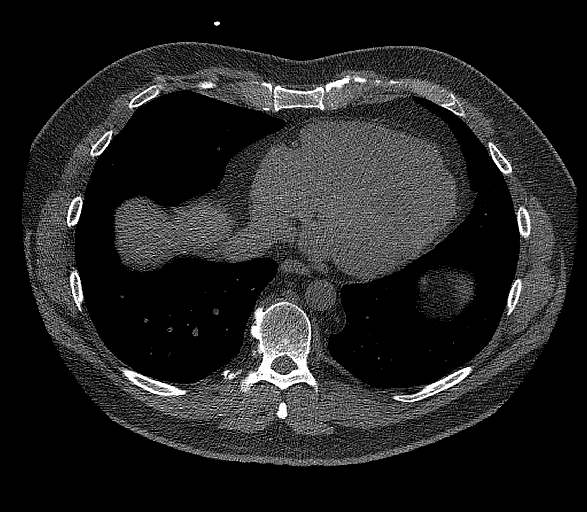
[im 45/89  vessel]
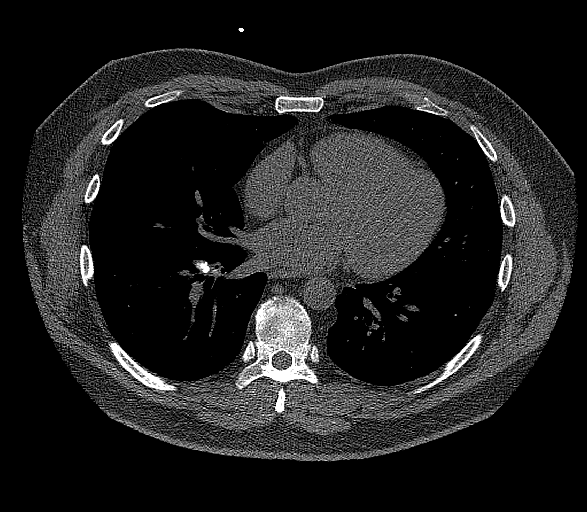
[im 59/89  vessel]
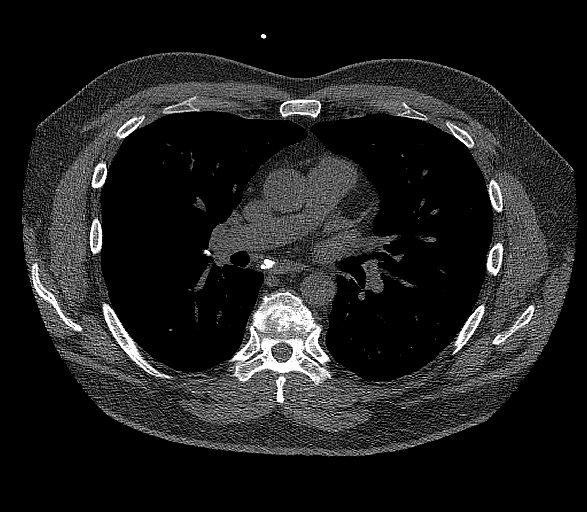
[im 74/89  vessel]
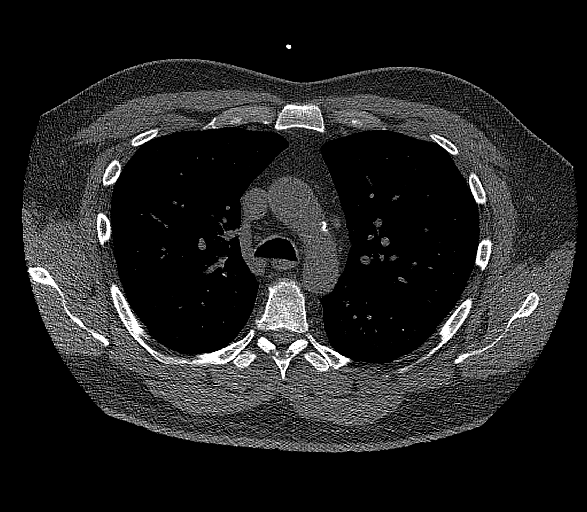

[14 of 20 positions shown; findings below may reference images not displayed]

FINDINGS: Technical quality: Good.

CORONARY CALCIUM

Total Agatston Score: 343

[HOSPITAL] percentile:  92nd

OTHER FINDINGS:

Aortic atherosclerosis. Calcified granulomas in the right lower
lobe. Multiple densely calcified right hilar and mediastinal lymph
nodes incidentally noted. Within the visualized portions of the
thorax there are no suspicious appearing pulmonary nodules or
masses, there is no acute consolidative airspace disease, no pleural
effusions, no pneumothorax and no lymphadenopathy. Visualized
portions of the upper abdomen are unremarkable. There are no
aggressive appearing lytic or blastic lesions noted in the
visualized portions of the skeleton.
IMPRESSION: 1. Patient's total coronary artery calcium score is 343 which is
92nd percentile for patient's of matched age, gender and
race/ethnicity. Please note that although the presence of coronary
artery calcium documents the presence of coronary artery disease,
the severity of this disease and any potential stenosis cannot be
assessed on this noncontrast CT examination. Assessment for
potential risk factor modification, dietary therapy or pharmacologic
therapy may be warranted, if clinically indicated.
2.  Aortic Atherosclerosis ([id number]).
3. Old granulomatous disease, as above.

## 2020-09-16 ENCOUNTER — Other Ambulatory Visit: Payer: Self-pay | Admitting: Medical

## 2020-09-16 NOTE — Telephone Encounter (Signed)
This is Dr. O'Neal's pt 

## 2020-11-06 ENCOUNTER — Other Ambulatory Visit: Payer: Self-pay | Admitting: Cardiovascular Disease

## 2021-02-23 NOTE — Progress Notes (Signed)
Cardiology Office Note:   Date:  02/24/2021  NAME:  James Cameron    MRN: [id number] DOB:  11-08-1961   PCP:  Alysia Penna, MD  Cardiologist:  Reatha Harps, MD  Electrophysiologist:  None   Referring MD: Alysia Penna, MD   Chief Complaint  Patient presents with  . Coronary Artery Disease    History of Present Illness:   James Cameron is a 60 y.o. male with a hx of NSTEMI, HTN, HLD who presents for follow-up. Sleep study ordered. Does not appear to have been done. His weight continues to go up.  He has gained 10 pounds in the last visit.  He reports he can get short of breath with exertion.  He had a recent stress test that was normal.  Echocardiogram showed normal LV function.  He is deconditioned.  On aspirin and Plavix without any major bleeding issues.  We had his LDL cholesterol at goal last year.  He will stop his Plavix on 03/27/2021.  He does need to work on his diet.  He reports he does not snore.  We have plan for a home sleep study.  He would like to hold on this for now.  Overall appears to be doing well from a cardiovascular standpoint just needs to lose weight.   Problem List 1. CAD -NSTEMI progressed to STEMI 03/27/2020 -PCI to mid LCX, PCI to mid RCA -65% mid LAD; normal NM stress6/03/2020 2. HTN 3. HLD -Total cholesterol 117, HDL 41, LDL 51, triglycerides 148 -A1c 5.9  Past Medical History: Past Medical History:  Diagnosis Date  . Allergy    hx sinus infections  . Arthritis   . Coronary artery disease   . Hyperlipidemia     Past Surgical History: Past Surgical History:  Procedure Laterality Date  . CARDIAC CATHETERIZATION    . CORONARY/GRAFT ACUTE MI REVASCULARIZATION N/A 03/27/2020   Procedure: Coronary/Graft Acute MI Revascularization;  Surgeon: Marykay Lex, MD;  Location: [facility] INVASIVE CV LAB;  Service: Cardiovascular;  Laterality: N/A;  . LEFT HEART CATH AND CORONARY ANGIOGRAPHY N/A 03/27/2020   Procedure: LEFT HEART CATH AND CORONARY  ANGIOGRAPHY;  Surgeon: Marykay Lex, MD;  Location: [facility] INVASIVE CV LAB;  Service: Cardiovascular;  Laterality: N/A;  . NECK SURGERY  2010   3 plates, 12 screws    Current Medications: Current Meds  Medication Sig  . aspirin EC 81 MG EC tablet Take 1 tablet (81 mg total) by mouth daily.  . Multiple Vitamin (MULTIVITAMIN) tablet Take 1 tablet by mouth daily.  . nitroGLYCERIN (NITROSTAT) 0.4 MG SL tablet PLACE 1 TAB UNDER TONGUE EVERY 5 MINUTES 3 DOSES AS NEEDED FOR CHEST PAIN  . rosuvastatin (CRESTOR) 5 MG tablet TAKE 1 TABLET BY MOUTH EVERY DAY  . [DISCONTINUED] clopidogrel (PLAVIX) 75 MG tablet Take 1 tablet (75 mg total) by mouth daily.     Allergies:    Penicillins   Social History: Social History   Socioeconomic History  . Marital status: Married    Spouse name: Not on file  . Number of children: Not on file  . Years of education: Not on file  . Highest education level: Not on file  Occupational History  . Not on file  Tobacco Use  . Smoking status: Former Games developer  . Smokeless tobacco: Never Used  Substance and Sexual Activity  . Alcohol use: Yes    Comment: very rarely  . Drug use: No  . Sexual activity: Not on file  Other Topics  Concern  . Not on file  Social History Narrative  . Not on file   Social Determinants of Health   Financial Resource Strain: Not on file  Food Insecurity: Not on file  Transportation Needs: Not on file  Physical Activity: Not on file  Stress: Not on file  Social Connections: Not on file     Family History: The patient's family history includes Crohn's disease in his mother; Heart attack in his father; Heart disease in his brother.  ROS:   All other ROS reviewed and negative. Pertinent positives noted in the HPI.     EKGs/Labs/Other Studies Reviewed:   The following studies were personally reviewed by me today:  NM Stress 05/16/2020  Nuclear stress EF: 48%.  The left ventricular ejection fraction is mildly decreased  (45-54%).  There was no ST segment deviation noted during stress.  The perfusion study is normal.  This is a low risk study.  Consider comparison of EF with 2D echo   TTE 03/28/2020 1. Left ventricular ejection fraction, by estimation, is 60 to 65%. The  left ventricle has normal function. The left ventricle has no regional  wall motion abnormalities. There is mild left ventricular hypertrophy.  Left ventricular diastolic parameters  were normal.  2. Right ventricular systolic function is normal. The right ventricular  size is normal.  3. The mitral valve is normal in structure. No evidence of mitral valve  regurgitation. No evidence of mitral stenosis.  4. The aortic valve is tricuspid. Aortic valve regurgitation is not  visualized. Mild aortic valve sclerosis is present, with no evidence of  aortic valve stenosis.  5. The inferior vena cava is normal in size with greater than 50%  respiratory variability, suggesting right atrial pressure of 3 mmHg.    LHC 03/27/2020  SEVERE TWO-VESSEL WITH MODERATE THIRD VESSEL CAD:  Long 95 to 99% proximal to mid LCx (culprit lesion) with TIMI I flow --> ? Successful DES PCI using Synergy DES 3.5 mm x 24 mm postdilated to 4.1 mm.  Additional 99% mid-distal RCA focal lesion (treated due to ongoing pain) ->  ? DES PCI using Synergy DES 2.5 mm 16 mm postdilated to 2.7 mm.  Tandem 40%, 60% and 40% lesions in the proximal to mid LAD on either side of first diagonal branch.  ? Plan for now to treat medically and assess after recovery from MI with two-vessel PCI.  Low normal to mildly reduced LVEF with initially severely elevated LVEDP that reduced after PCI and diuresis from 33 down to 12 mmHg.    Recent Labs: 03/26/2020: ALT 22; B Natriuretic Peptide 42.0 03/28/2020: Magnesium 1.9 03/31/2020: BUN 13; Creatinine, Ser 1.32; Hemoglobin 13.2; Platelets 236; Potassium 3.6; Sodium 133   Recent Lipid Panel    Component Value Date/Time    CHOL 117 05/08/2020 0946   TRIG 148 05/08/2020 0946   HDL 41 05/08/2020 0946   CHOLHDL 2.9 05/08/2020 0946   CHOLHDL 4.4 03/27/2020 0056   VLDL 23 03/27/2020 0056   LDLCALC 51 05/08/2020 0946    Physical Exam:   VS:  BP 138/80   Pulse 65   Ht 5\' 10"  (1.778 m)   Wt 270 lb (122.5 kg)   SpO2 99%   BMI 38.74 kg/m    Wt Readings from Last 3 Encounters:  02/24/21 270 lb (122.5 kg)  08/07/20 260 lb (117.9 kg)  05/30/20 258 lb (117 kg)    General: Well nourished, well developed, in no acute distress Head: Atraumatic, normal  size  Eyes: PEERLA, EOMI  Neck: Supple, no JVD Endocrine: No thryomegaly Cardiac: Normal S1, S2; RRR; no murmurs, rubs, or gallops Lungs: Clear to auscultation bilaterally, no wheezing, rhonchi or rales  Abd: Soft, nontender, no hepatomegaly  Ext: No edema, pulses 2+ Musculoskeletal: No deformities, BUE and BLE strength normal and equal Skin: Warm and dry, no rashes   Neuro: Alert and oriented to person, place, time, and situation, CNII-XII grossly intact, no focal deficits  Psych: Normal mood and affect   ASSESSMENT:   James Cameron is a 60 y.o. male who presents for the following: 1. Coronary artery disease involving native coronary artery of native heart without angina pectoris   2. Mixed hyperlipidemia   3. Essential hypertension   4. Obesity (BMI 30-39.9)     PLAN:   1. Coronary artery disease involving native coronary artery of native heart without angina pectoris 2. Mixed hyperlipidemia -NSTEMI progressed to STEMI 03/27/2020 -PCI to mid LCX, PCI to mid RCA -65% mid LAD; normal NM stress6/03/2020 -He will complete 1 year of DAPT on 03/27/2021.  After this time he will remain on aspirin indefinitely.  He is on Crestor 5 mg daily and Zetia 10 mg daily.  His LDL cholesterol last year was well controlled at 54.  He will forward [place] the results of his labs from his primary care physician. -BP acceptable today.  Continue metoprolol tartrate 12.5 twice a  day. -He really needs to lose weight.  BMI 38.  I think he will feel better and have less shortness of breath with weight loss.  He will work on this.  3. Essential hypertension -Acceptable today.  4. Obesity (BMI 30-39.9) -Diet exercise recommended.  Disposition: Return in about 6 months (around 08/27/2021).  Medication Adjustments/Labs and Tests Ordered: Current medicines are reviewed at length with the patient today.  Concerns regarding medicines are outlined above.  No orders of the defined types were placed in this encounter.  Meds ordered this encounter  Medications  . clopidogrel (PLAVIX) 75 MG tablet    Sig: Take 1 tablet (75 mg total) by mouth daily.    Dispense:  30 tablet    Refill:  0    Patient Instructions  Medication Instructions:  Stop Plavix on 4/15- we have sent in a 30 day supply for you.   *If you need a refill on your cardiac medications before your next appointment, please call your pharmacy*   Follow-Up: At [facility], you and your health needs are our priority.  As part of our continuing mission to provide you with exceptional heart care, we have created designated Provider Care Teams.  These Care Teams include your primary Cardiologist (physician) and Advanced Practice Providers (APPs -  Physician Assistants and Nurse Practitioners) who all work together to provide you with the care you need, when you need it.  We recommend signing up for the patient portal called "MyChart".  Sign up information is provided on this After Visit Summary.  MyChart is used to connect with patients for Virtual Visits (Telemedicine).  Patients are able to view lab/test results, encounter notes, upcoming appointments, etc.  Non-urgent messages can be sent to your provider as well.   To learn more about what you can do with MyChart, go to [email or web address].    Your next appointment:   6 month(s)  The format for your next appointment:   In Person  Provider:   Lennie Odor, MD       Time Spent with Patient:  I have spent a total of 25 minutes with patient reviewing hospital notes, telemetry, EKGs, labs and examining the patient as well as establishing an assessment and plan that was discussed with the patient.  > 50% of time was spent in direct patient care.  Signed, Lenna GilfordWesley T. Flora Lipps'Neal, MD, [facility] [place]  [facility]  [address], [address] [place], [place] [id number] [phone]  02/24/2021 3:52 PM

## 2021-02-24 ENCOUNTER — Ambulatory Visit: Payer: 59 | Admitting: Cardiovascular Disease

## 2021-02-24 ENCOUNTER — Encounter: Payer: Self-pay | Admitting: Cardiovascular Disease

## 2021-02-24 ENCOUNTER — Other Ambulatory Visit: Payer: Self-pay

## 2021-02-24 VITALS — BP 138/80 | HR 65 | Ht 70.0 in | Wt 270.0 lb

## 2021-02-24 DIAGNOSIS — E669 Obesity, unspecified: Secondary | ICD-10-CM

## 2021-02-24 DIAGNOSIS — I10 Essential (primary) hypertension: Secondary | ICD-10-CM

## 2021-02-24 DIAGNOSIS — E782 Mixed hyperlipidemia: Secondary | ICD-10-CM

## 2021-02-24 DIAGNOSIS — I2510 Atherosclerotic heart disease of native coronary artery without angina pectoris: Secondary | ICD-10-CM

## 2021-02-24 MED ORDER — CLOPIDOGREL BISULFATE 75 MG PO TABS
75.0000 mg | ORAL_TABLET | Freq: Every day | ORAL | 0 refills | Status: DC
Start: 2021-02-24 — End: 2021-03-18

## 2021-02-24 NOTE — Patient Instructions (Signed)
Medication Instructions:  Stop Plavix on 4/15- we have sent in a 30 day supply for you.   *If you need a refill on your cardiac medications before your next appointment, please call your pharmacy*   Follow-Up: At [facility], you and your health needs are our priority.  As part of our continuing mission to provide you with exceptional heart care, we have created designated Provider Care Teams.  These Care Teams include your primary Cardiologist (physician) and Advanced Practice Providers (APPs -  Physician Assistants and Nurse Practitioners) who all work together to provide you with the care you need, when you need it.  We recommend signing up for the patient portal called "MyChart".  Sign up information is provided on this After Visit Summary.  MyChart is used to connect with patients for Virtual Visits (Telemedicine).  Patients are able to view lab/test results, encounter notes, upcoming appointments, etc.  Non-urgent messages can be sent to your provider as well.   To learn more about what you can do with MyChart, go to [email or web address].    Your next appointment:   6 month(s)  The format for your next appointment:   In Person  Provider:   Lennie Odor, MD

## 2021-03-18 ENCOUNTER — Other Ambulatory Visit: Payer: Self-pay | Admitting: Cardiovascular Disease

## 2021-04-10 ENCOUNTER — Other Ambulatory Visit: Payer: Self-pay | Admitting: Cardiovascular Disease

## 2021-05-20 ENCOUNTER — Other Ambulatory Visit: Payer: Self-pay | Admitting: Medical

## 2021-05-20 NOTE — Telephone Encounter (Signed)
This is Dr. O'Neal's pt 

## 2021-08-17 ENCOUNTER — Other Ambulatory Visit: Payer: Self-pay | Admitting: Cardiovascular Disease

## 2021-10-12 ENCOUNTER — Other Ambulatory Visit: Payer: Self-pay | Admitting: Cardiovascular Disease

## 2022-02-03 NOTE — Progress Notes (Signed)
Cardiology Office Note:   Date:  02/04/2022  NAME:  James Cameron    MRN: [id number] DOB:  Jun 19, 1961   PCP:  Velna Hatchet, MD  Cardiologist:  Evalina Field, MD  Electrophysiologist:  None   Referring MD: Velna Hatchet, MD   Chief Complaint  Patient presents with   Follow-up    6 months.   History of Present Illness:   James Cameron is a 61 y.o. male with a hx of CAD, HTN, HLD who presents for follow-up.  Overall doing well.  Occasional chest tightness.  Not taking nitroglycerin.  Still working as a Administrator.  On aspirin.  He reports he stopped taking Crestor.  Still having cramping in his legs.  Okay to go back on Crestor.  BP 112/68.  He is on Zetia.  Most recent LDL cholesterol not at goal.  He apparently stopped taking his Crestor.  Needs to get back on this.  Neck step would be PCSK9 inhibitor.  Denies any symptoms of significant chest discomfort or shortness of breath.  EKG shows sinus rhythm.  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  He is going to find out the exact date he needs this done.  Still has not lost any weight.  Trying to do this.  Not diabetic.  Repeat lab per primary care physician later this summer.  Problem List 1. CAD -NSTEMI progressed to STEMI 03/27/2020 -PCI to mid LCX, PCI to mid RCA -65% mid LAD; normal NM stress 05/16/2020 2. HTN 3. HLD -Total cholesterol 139, HDL 38, LDL 77, triglycerides 120 -A1c 5.9  Past Medical History: Past Medical History:  Diagnosis Date   Allergy    hx sinus infections   Arthritis    Coronary artery disease    Hyperlipidemia     Past Surgical History: Past Surgical History:  Procedure Laterality Date   CARDIAC CATHETERIZATION     CORONARY/GRAFT ACUTE MI REVASCULARIZATION N/A 03/27/2020   Procedure: Coronary/Graft Acute MI Revascularization;  Surgeon: Leonie Man, MD;  Location: [place] CV LAB;  Service: Cardiovascular;  Laterality: N/A;   LEFT HEART CATH AND CORONARY ANGIOGRAPHY  N/A 03/27/2020   Procedure: LEFT HEART CATH AND CORONARY ANGIOGRAPHY;  Surgeon: Leonie Man, MD;  Location: [place] CV LAB;  Service: Cardiovascular;  Laterality: N/A;   NECK SURGERY  2010   3 plates, 12 screws    Current Medications: Current Meds  Medication Sig   ASPIRIN LOW DOSE 81 MG EC tablet TAKE 1 TABLET BY MOUTH EVERY DAY   ezetimibe (ZETIA) 10 MG tablet TAKE 1 TABLET BY MOUTH EVERY DAY   metoprolol tartrate (LOPRESSOR) 25 MG tablet TAKE 1/2 A TABLET(12.5 MG TOTAL) BY MOUTH 2 TIMES DAILY.   Multiple Vitamin (MULTIVITAMIN) tablet Take 1 tablet by mouth daily.   nitroGLYCERIN (NITROSTAT) 0.4 MG SL tablet PLACE 1 TAB UNDER TONGUE EVERY 5 MINUTES 3 DOSES AS NEEDED FOR CHEST PAIN   rosuvastatin (CRESTOR) 5 MG tablet Take 1 tablet (5 mg total) by mouth daily.   [DISCONTINUED] clopidogrel (PLAVIX) 75 MG tablet TAKE 1 TABLET BY MOUTH EVERY DAY   [DISCONTINUED] rosuvastatin (CRESTOR) 5 MG tablet TAKE 1 TABLET BY MOUTH EVERY DAY     Allergies:    Penicillins   Social History: Social History   Socioeconomic History   Marital status: Married    Spouse name: Not on file   Number of children: Not on file   Years of education: Not on file  Highest education level: Not on file  Occupational History   Not on file  Tobacco Use   Smoking status: Former   Smokeless tobacco: Never  Substance and Sexual Activity   Alcohol use: Yes    Comment: very rarely   Drug use: No   Sexual activity: Not on file  Other Topics Concern   Not on file  Social History Narrative   Not on file   Social Determinants of Health   Financial Resource Strain: Not on file  Food Insecurity: Not on file  Transportation Needs: Not on file  Physical Activity: Not on file  Stress: Not on file  Social Connections: Not on file     Family History: The patient's family history includes Crohn's disease in his mother; Heart attack in his father; Heart disease in his brother.  ROS:   All other ROS  reviewed and negative. Pertinent positives noted in the HPI.     EKGs/Labs/Other Studies Reviewed:   The following studies were personally reviewed by me today:  EKG:  EKG is ordered today.  The ekg ordered today demonstrates normal sinus rhythm heart rate 69, no acute ischemic changes or evidence of infarction, and was personally reviewed by me.   NM Stress 05/16/2020 Nuclear stress EF: 48%. The left ventricular ejection fraction is mildly decreased (45-54%). There was no ST segment deviation noted during stress. The perfusion study is normal. This is a low risk study. Consider comparison of EF with 2D echo  LHC 03/27/2020 SEVERE TWO-VESSEL WITH MODERATE THIRD VESSEL CAD: Long 95 to 99% proximal to mid LCx (culprit lesion) with TIMI I flow --> Successful DES PCI using Synergy DES 3.5 mm x 24 mm postdilated to 4.1 mm. Additional 99% mid-distal RCA focal lesion (treated due to ongoing pain) ->  DES PCI using Synergy DES 2.5 mm 16 mm postdilated to 2.7 mm. Tandem 40%, 60% and 40% lesions in the proximal to mid LAD on either side of first diagonal branch.  Plan for now to treat medically and assess after recovery from MI with two-vessel PCI. Low normal to mildly reduced LVEF with initially severely elevated LVEDP that reduced after PCI and diuresis from 33 down to 12 mmHg.  Recent Labs: No results found for requested labs within last 8760 hours.   Recent Lipid Panel    Component Value Date/Time   CHOL 117 05/08/2020 0946   TRIG 148 05/08/2020 0946   HDL 41 05/08/2020 0946   CHOLHDL 2.9 05/08/2020 0946   CHOLHDL 4.4 03/27/2020 0056   VLDL 23 03/27/2020 0056   LDLCALC 51 05/08/2020 0946    Physical Exam:   VS:  BP 112/68 (BP Location: Left Arm, Patient Position: Sitting, Cuff Size: Large)    Pulse 69    Ht 5\' 10"  (1.778 m)    Wt 272 lb (123.4 kg)    BMI 39.03 kg/m    Wt Readings from Last 3 Encounters:  02/04/22 272 lb (123.4 kg)  02/24/21 270 lb (122.5 kg)  08/07/20 260 lb  (117.9 kg)    General: Well nourished, well developed, in no acute distress Head: Atraumatic, normal size  Eyes: PEERLA, EOMI  Neck: Supple, no JVD Endocrine: No thryomegaly Cardiac: Normal S1, S2; RRR; no murmurs, rubs, or gallops Lungs: Clear to auscultation bilaterally, no wheezing, rhonchi or rales  Abd: Soft, nontender, no hepatomegaly  Ext: No edema, pulses 2+ Musculoskeletal: No deformities, BUE and BLE strength normal and equal Skin: Warm and dry, no rashes   Neuro:  Alert and oriented to person, place, time, and situation, CNII-XII grossly intact, no focal deficits  Psych: Normal mood and affect   ASSESSMENT:   James Cameron is a 61 y.o. male who presents for the following: 1. Coronary artery disease involving native coronary artery of native heart without angina pectoris   2. Mixed hyperlipidemia   3. Essential hypertension   4. Obesity (BMI 30-39.9)     PLAN:   1. Coronary artery disease involving native coronary artery of native heart without angina pectoris 2. Mixed hyperlipidemia 3. Essential hypertension 4. Obesity (BMI 30-39.9) -Non-STEMI that progressed to STEMI 03/27/2020.  PCI to the mid circumflex.  Also underwent PCI to the RCA.  He is a 65% stenosis in the LAD.  Normal stress test 05/16/2020.  No significant chest pain symptoms.  EKG is normal.  He will need a DOT physical in the next few months.  We will likely set him up for cardiac PET.  He will let us know when this needs to be done.  For now we will continue aspirin 81 mg daily.  On Zetia 10 mg daily.  Most recent LDL 77.  We stopped Crestor due to cramping.  Symptoms of cramping still exist.  I suspect this is arthritis.  He is okay to go back on Crestor 5 mg daily.  Blood pressure is well controlled.  He will continue metoprolol tartrate 12.5 mg twice daily.  I have encouraged him to continue to try to lose weight.  He is not diabetic.  Needs to exercise more as well.  He is quite sedentary.  He works as a  Naval architect.  He will see me back yearly.  We will set him up for his DOT stress test.     Disposition: Return in about 1 year (around 02/04/2023).  Medication Adjustments/Labs and Tests Ordered: Current medicines are reviewed at length with the patient today.  Concerns regarding medicines are outlined above.  Orders Placed This Encounter  Procedures   EKG 12-Lead   Meds ordered this encounter  Medications   rosuvastatin (CRESTOR) 5 MG tablet    Sig: Take 1 tablet (5 mg total) by mouth daily.    Dispense:  90 tablet    Refill:  1    Patient Instructions  Medication Instructions:  START Crestor 5 mg daily - if cramps occur you can take every other day.  *If you need a refill on your cardiac medications before your next appointment, please call your pharmacy*   Lab Work: Send labs when completed from PCP (fax number [phone])  If you have labs (blood work) drawn today and your tests are completely normal, you will receive your results only by: MyChart Message (if you have MyChart) OR A paper copy in the mail If you have any lab test that is abnormal or we need to change your treatment, we will call you to review the results.   Follow-Up: At [facility], you and your health needs are our priority.  As part of our continuing mission to provide you with exceptional heart care, we have created designated Provider Care Teams.  These Care Teams include your primary Cardiologist (physician) and Advanced Practice Providers (APPs -  Physician Assistants and Nurse Practitioners) who all work together to provide you with the care you need, when you need it.  We recommend signing up for the patient portal called "MyChart".  Sign up information is provided on this After Visit Summary.  MyChart is used to connect  with patients for Virtual Visits (Telemedicine).  Patients are able to view lab/test results, encounter notes, upcoming appointments, etc.  Non-urgent messages can be sent to  your provider as well.   To learn more about what you can do with MyChart, go to [email or web address].    Your next appointment:   12 month(s)  The format for your next appointment:   In Person  Provider:   Evalina Field, MD       Time Spent with Patient: I have spent a total of 35 minutes with patient reviewing hospital notes, telemetry, EKGs, labs and examining the patient as well as establishing an assessment and plan that was discussed with the patient.  > 50% of time was spent in direct patient care.  Signed, Addison Naegeli. Audie Box, MD, [place]  [address] [place], [place] [address] [phone]  02/04/2022 3:42 PM

## 2022-02-04 ENCOUNTER — Other Ambulatory Visit: Payer: Self-pay

## 2022-02-04 ENCOUNTER — Encounter: Payer: Self-pay | Admitting: Cardiovascular Disease

## 2022-02-04 ENCOUNTER — Ambulatory Visit: Payer: BC Managed Care – PPO | Admitting: Cardiovascular Disease

## 2022-02-04 VITALS — BP 112/68 | HR 69 | Ht 70.0 in | Wt 272.0 lb

## 2022-02-04 DIAGNOSIS — I2510 Atherosclerotic heart disease of native coronary artery without angina pectoris: Secondary | ICD-10-CM

## 2022-02-04 DIAGNOSIS — E782 Mixed hyperlipidemia: Secondary | ICD-10-CM

## 2022-02-04 DIAGNOSIS — I10 Essential (primary) hypertension: Secondary | ICD-10-CM

## 2022-02-04 DIAGNOSIS — E669 Obesity, unspecified: Secondary | ICD-10-CM | POA: Diagnosis not present

## 2022-02-04 MED ORDER — ROSUVASTATIN CALCIUM 5 MG PO TABS
5.0000 mg | ORAL_TABLET | Freq: Every day | ORAL | 1 refills | Status: DC
Start: 2022-02-04 — End: 2022-05-05

## 2022-02-04 NOTE — Patient Instructions (Signed)
Medication Instructions:  START Crestor 5 mg daily - if cramps occur you can take every other day.  *If you need a refill on your cardiac medications before your next appointment, please call your pharmacy*   Lab Work: Send labs when completed from PCP (fax number [phone])  If you have labs (blood work) drawn today and your tests are completely normal, you will receive your results only by: MyChart Message (if you have MyChart) OR A paper copy in the mail If you have any lab test that is abnormal or we need to change your treatment, we will call you to review the results.   Follow-Up: At [facility], you and your health needs are our priority.  As part of our continuing mission to provide you with exceptional heart care, we have created designated Provider Care Teams.  These Care Teams include your primary Cardiologist (physician) and Advanced Practice Providers (APPs -  Physician Assistants and Nurse Practitioners) who all work together to provide you with the care you need, when you need it.  We recommend signing up for the patient portal called "MyChart".  Sign up information is provided on this After Visit Summary.  MyChart is used to connect with patients for Virtual Visits (Telemedicine).  Patients are able to view lab/test results, encounter notes, upcoming appointments, etc.  Non-urgent messages can be sent to your provider as well.   To learn more about what you can do with MyChart, go to [email or web address].    Your next appointment:   12 month(s)  The format for your next appointment:   In Person  Provider:   Reatha Harps, MD

## 2022-03-11 ENCOUNTER — Telehealth: Payer: Self-pay

## 2022-03-11 ENCOUNTER — Other Ambulatory Visit (HOSPITAL_COMMUNITY): Payer: Self-pay | Admitting: Cardiovascular Disease

## 2022-03-11 DIAGNOSIS — I25119 Atherosclerotic heart disease of native coronary artery with unspecified angina pectoris: Secondary | ICD-10-CM

## 2022-03-11 NOTE — Telephone Encounter (Signed)
Contacted patient in regards to Cardiac PET scan- patient would like to have this completed. He states it would need to be done end of April/first of May for his DOT physical.  ?MD to be made aware.  ?Instructions to follow.  ? ? ?

## 2022-04-05 ENCOUNTER — Telehealth (HOSPITAL_COMMUNITY): Payer: Self-pay | Admitting: Emergency Medicine

## 2022-04-05 NOTE — Telephone Encounter (Signed)
Reaching out to patient to offer assistance regarding upcoming cardiac imaging study; pt verbalizes understanding of appt date/time, parking situation and where to check in, pre-test NPO status and medications ordered, and verified current allergies; name and call back number provided for further questions should they arise ?Rockwell Alexandria RN Navigator Cardiac Imaging ?[facility] ?[phone] office ?[phone] cell ? ?Denies iv issues ?Arrival 800 ?

## 2022-04-05 NOTE — Telephone Encounter (Signed)
Attempted to call patient regarding upcoming cardiac CT appointment. °Left message on voicemail with name and callback number °Haleigh Desmith RN Navigator Cardiac Imaging °[facility] °[phone] Office °[phone] Cell ° °

## 2022-04-06 ENCOUNTER — Ambulatory Visit (HOSPITAL_COMMUNITY)
Admission: RE | Admit: 2022-04-06 | Discharge: 2022-04-06 | Disposition: A | Discharge status: Home / Self Care | Payer: BC Managed Care – PPO | Source: Ambulatory Visit | Attending: Cardiovascular Disease | Admitting: Cardiovascular Disease

## 2022-04-06 DIAGNOSIS — I25119 Atherosclerotic heart disease of native coronary artery with unspecified angina pectoris: Secondary | ICD-10-CM | POA: Insufficient documentation

## 2022-04-06 DIAGNOSIS — R918 Other nonspecific abnormal finding of lung field: Secondary | ICD-10-CM | POA: Diagnosis not present

## 2022-04-06 LAB — NM PET CT CARDIAC PERFUSION MULTI W/ABSOLUTE BLOODFLOW
Base ST Depression (mm): 0 mm
LV dias vol: 130 mL (ref 62–150)
LV sys vol: 51 mL
MBFR: 2
Nuc Rest EF: 48 %
Nuc Stress EF: 56 %
Peak HR: 86 {beats}/min
Rest HR: 51 {beats}/min
Rest MBF: 0.6 ml/g/min
Rest Nuclear Isotope Dose: 30 mCi
ST Depression (mm): 0 mm
Stress MBF: 1.2 ml/g/min
Stress Nuclear Isotope Dose: 30 mCi
TID: 0.96

## 2022-04-06 MED ORDER — RUBIDIUM RB82 GENERATOR (RUBYFILL)
29.9700 | PACK | Freq: Once | INTRAVENOUS | Status: AC
Start: 2022-04-06 — End: 2022-04-06
  Administered 2022-04-06: 29.97 via INTRAVENOUS

## 2022-04-06 MED ORDER — RUBIDIUM RB82 GENERATOR (RUBYFILL)
29.9900 | PACK | Freq: Once | INTRAVENOUS | Status: AC
  Administered 2022-04-06: 29.99 via INTRAVENOUS

## 2022-04-06 MED ORDER — REGADENOSON 0.4 MG/5ML IV SOLN
INTRAVENOUS | Status: AC
  Filled 2022-04-06: qty 5

## 2022-04-17 ENCOUNTER — Encounter: Payer: Self-pay | Admitting: Cardiovascular Disease

## 2022-05-01 ENCOUNTER — Other Ambulatory Visit: Payer: Self-pay | Admitting: Cardiovascular Disease

## 2022-05-05 ENCOUNTER — Other Ambulatory Visit: Payer: Self-pay

## 2022-05-05 MED ORDER — ROSUVASTATIN CALCIUM 5 MG PO TABS: 5.0000 mg | ORAL_TABLET | Freq: Every day | ORAL | 3 refills | Status: DC

## 2022-06-25 DIAGNOSIS — I10 Essential (primary) hypertension: Secondary | ICD-10-CM | POA: Diagnosis not present

## 2022-06-25 DIAGNOSIS — Z125 Encounter for screening for malignant neoplasm of prostate: Secondary | ICD-10-CM | POA: Diagnosis not present

## 2022-06-28 DIAGNOSIS — R739 Hyperglycemia, unspecified: Secondary | ICD-10-CM | POA: Diagnosis not present

## 2022-07-02 DIAGNOSIS — I10 Essential (primary) hypertension: Secondary | ICD-10-CM | POA: Diagnosis not present

## 2022-07-02 DIAGNOSIS — Z1339 Encounter for screening examination for other mental health and behavioral disorders: Secondary | ICD-10-CM | POA: Diagnosis not present

## 2022-07-02 DIAGNOSIS — Z0000 Encounter for general adult medical examination without abnormal findings: Secondary | ICD-10-CM | POA: Diagnosis not present

## 2022-07-02 DIAGNOSIS — Z1331 Encounter for screening for depression: Secondary | ICD-10-CM | POA: Diagnosis not present

## 2022-07-02 DIAGNOSIS — D6869 Other thrombophilia: Secondary | ICD-10-CM | POA: Diagnosis not present

## 2023-01-22 ENCOUNTER — Other Ambulatory Visit: Payer: Self-pay | Admitting: Cardiovascular Disease

## 2023-04-22 ENCOUNTER — Other Ambulatory Visit: Payer: Self-pay | Admitting: Cardiovascular Disease

## 2023-04-24 ENCOUNTER — Other Ambulatory Visit: Payer: Self-pay | Admitting: Cardiovascular Disease

## 2023-04-28 ENCOUNTER — Other Ambulatory Visit: Payer: Self-pay | Admitting: Cardiovascular Disease

## 2023-05-22 ENCOUNTER — Other Ambulatory Visit: Payer: Self-pay | Admitting: Cardiovascular Disease

## 2023-05-24 ENCOUNTER — Telehealth: Payer: Self-pay | Admitting: Cardiovascular Disease

## 2023-05-24 NOTE — Telephone Encounter (Signed)
Carrie from Dr. Girtha Rm office called and said that they need the latest stress tests that the patient took. Send to [phone]

## 2023-05-24 NOTE — Telephone Encounter (Signed)
Aleene Davidson NP [employer], request Fax of stress test needed and OV note as patient having DOD physical done today.  Called to patient and he is currently in office. He states it is OK to send information to the office. Verified the fax number given and information sent.

## 2023-05-26 ENCOUNTER — Other Ambulatory Visit: Payer: Self-pay | Admitting: Cardiovascular Disease

## 2023-06-24 ENCOUNTER — Other Ambulatory Visit: Payer: Self-pay | Admitting: Cardiovascular Disease

## 2023-07-14 DIAGNOSIS — E1169 Type 2 diabetes mellitus with other specified complication: Secondary | ICD-10-CM | POA: Diagnosis not present

## 2023-07-14 DIAGNOSIS — Z125 Encounter for screening for malignant neoplasm of prostate: Secondary | ICD-10-CM | POA: Diagnosis not present

## 2023-07-14 DIAGNOSIS — E785 Hyperlipidemia, unspecified: Secondary | ICD-10-CM | POA: Diagnosis not present

## 2023-07-21 ENCOUNTER — Other Ambulatory Visit: Payer: Self-pay | Admitting: Cardiovascular Disease

## 2023-07-21 DIAGNOSIS — M791 Myalgia, unspecified site: Secondary | ICD-10-CM | POA: Diagnosis not present

## 2023-07-21 DIAGNOSIS — D692 Other nonthrombocytopenic purpura: Secondary | ICD-10-CM | POA: Diagnosis not present

## 2023-07-21 DIAGNOSIS — Z1331 Encounter for screening for depression: Secondary | ICD-10-CM | POA: Diagnosis not present

## 2023-07-21 DIAGNOSIS — I10 Essential (primary) hypertension: Secondary | ICD-10-CM | POA: Diagnosis not present

## 2023-07-21 DIAGNOSIS — R82998 Other abnormal findings in urine: Secondary | ICD-10-CM | POA: Diagnosis not present

## 2023-07-21 DIAGNOSIS — E1169 Type 2 diabetes mellitus with other specified complication: Secondary | ICD-10-CM | POA: Diagnosis not present

## 2023-07-21 DIAGNOSIS — Z1339 Encounter for screening examination for other mental health and behavioral disorders: Secondary | ICD-10-CM | POA: Diagnosis not present

## 2023-07-21 DIAGNOSIS — Z0000 Encounter for general adult medical examination without abnormal findings: Secondary | ICD-10-CM | POA: Diagnosis not present

## 2023-07-21 DIAGNOSIS — I25810 Atherosclerosis of coronary artery bypass graft(s) without angina pectoris: Secondary | ICD-10-CM | POA: Diagnosis not present

## 2023-07-21 DIAGNOSIS — E785 Hyperlipidemia, unspecified: Secondary | ICD-10-CM | POA: Diagnosis not present

## 2023-07-21 DIAGNOSIS — D6869 Other thrombophilia: Secondary | ICD-10-CM | POA: Diagnosis not present

## 2023-07-21 DIAGNOSIS — E669 Obesity, unspecified: Secondary | ICD-10-CM | POA: Diagnosis not present

## 2023-07-29 ENCOUNTER — Other Ambulatory Visit: Payer: Self-pay | Admitting: Cardiovascular Disease

## 2023-07-30 ENCOUNTER — Other Ambulatory Visit: Payer: Self-pay | Admitting: Cardiovascular Disease

## 2023-08-12 ENCOUNTER — Other Ambulatory Visit: Payer: Self-pay | Admitting: Cardiovascular Disease

## 2023-10-08 NOTE — Progress Notes (Unsigned)
Cardiology Office Note:  .   Date:  10/10/2023  ID:  James Cameron, DOB 1961-01-30, MRN [id number] PCP: Alysia Penna, MD  [facility] Providers Cardiologist:  Reatha Harps, MD { History of Present Illness: .   James Cameron is a 62 y.o. male with history of CAD who presents for follow-up.   Discussed the use of AI scribe software for clinical note transcription with the patient, who gave verbal consent to proceed.  History of Present Illness   James Cameron, a 62 year old with a history of STEMI in April 2021, hypertension, and hyperlipidemia, presents for a routine follow-up. He reports recent episodes of severe headaches, originating in the neck and radiating to the top of his head. He describes the pain as intense, feeling as if his head might "explode." Over-the-counter Tylenol provides some relief. He denies any sinus congestion or other symptoms suggestive of sinusitis.  He is doing well without symptoms of CP or SOB.   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 He acknowledges the need for weight loss, noting some fluctuation in his weight recently. 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          Problem List 1. CAD -NSTEMI progressed to STEMI 03/27/2020 -PCI to mid LCX, PCI to mid RCA -65% mid LAD -normal PET MPI 04/06/2022 2. HTN 3. HLD -T chol 132, HDL 46, LDL 59, TG 135 -A1c 6.2    ROS: All other ROS reviewed and negative. Pertinent positives noted in the HPI.     Studies Reviewed: .    Cardiac PET Stress 03/2022 -> Negative EKG Interpretation Date/Time:  Monday October 10 2023 07:53:38 EDT Ventricular Rate:  52 PR Interval:  156 QRS Duration:  96 QT Interval:  440 QTC Calculation: 409 R Axis:   15  Text Interpretation: Sinus bradycardia Normal ECG Confirmed by Lennie Odor [phone]) on 10/10/2023 8:03:18 AM   Physical Exam:   VS:  BP 136/64 (BP Location:  Right Arm, Patient Position: Sitting, Cuff Size: Large)   Pulse (!) 52   Ht 5\' 10"  (1.778 m)   Wt 267 lb (121.1 kg)   BMI 38.31 kg/m    Wt Readings from Last 3 Encounters:  10/10/23 267 lb (121.1 kg)  02/04/22 272 lb (123.4 kg)  02/24/21 270 lb (122.5 kg)    GEN: Well nourished, well developed in no acute distress NECK: No JVD; No carotid bruits CARDIAC: RRR, no murmurs, rubs, gallops RESPIRATORY:  Clear to auscultation without rales, wheezing or rhonchi  ABDOMEN: Soft, non-tender, non-distended EXTREMITIES:  No edema; No deformity  ASSESSMENT AND PLAN: .   Assessment and Plan    Coronary Artery Disease (CAD) History of STEMI in April 2021 with residual 65% lesion in mid LAD managed medically. Negative cardiac PET stress test last year. No symptoms of angina reported today. -Continue Aspirin 81mg  daily, metoprolol 12.5 mg BID, crestor and zetia.  -Reach out to primary care physician for recent lab work.  Hyperlipidemia LDL cholesterol in 2023 was 59, which is at goal. Patient is on Crestor 5mg  daily and Zetia 10mg  daily. -Continue Crestor 5mg  daily and Zetia 10mg  daily.  Hypertension Blood pressure well controlled on Metoprolol Tartrate 12.5mg  BID. -Continue Metoprolol Tartrate 12.5mg  BID.  Obesity BMI is 38. Encouraged weight loss and exercise. -Encourage continued efforts for weight loss and exercise.  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 -Plan for DOT physical next year.  Follow-up No acute issues identified today. -Return to clinic in 1 year.              Follow-up: No follow-ups on file.  Time Spent with Patient: I have spent a total of 25 minutes with patient reviewing hospital notes, telemetry, EKGs, labs and examining the patient as well as establishing an assessment and plan that was discussed with the patient.  > 50% of time was spent in direct patient  care.  Signed, Lenna Gilford. Flora Lipps, MD, [facility]  [facility]  [address] [place], [place] [address] [phone]  8:59 AM

## 2023-10-10 ENCOUNTER — Ambulatory Visit: Payer: 59 | Attending: Cardiovascular Disease | Admitting: Cardiovascular Disease

## 2023-10-10 ENCOUNTER — Encounter: Payer: Self-pay | Admitting: Cardiovascular Disease

## 2023-10-10 VITALS — BP 136/64 | HR 52 | Ht 70.0 in | Wt 267.0 lb

## 2023-10-10 DIAGNOSIS — I10 Essential (primary) hypertension: Secondary | ICD-10-CM | POA: Diagnosis not present

## 2023-10-10 DIAGNOSIS — E782 Mixed hyperlipidemia: Secondary | ICD-10-CM | POA: Diagnosis not present

## 2023-10-10 DIAGNOSIS — E669 Obesity, unspecified: Secondary | ICD-10-CM | POA: Diagnosis not present

## 2023-10-10 DIAGNOSIS — I2510 Atherosclerotic heart disease of native coronary artery without angina pectoris: Secondary | ICD-10-CM | POA: Diagnosis not present

## 2023-10-10 MED ORDER — ROSUVASTATIN CALCIUM 5 MG PO TABS: 5.0000 mg | ORAL_TABLET | Freq: Every day | ORAL | 3 refills | Status: DC

## 2023-10-10 MED ORDER — EZETIMIBE 10 MG PO TABS: 10.0000 mg | ORAL_TABLET | Freq: Every day | ORAL | 3 refills | Status: DC

## 2023-10-10 MED ORDER — METOPROLOL TARTRATE 25 MG PO TABS: 12.5000 mg | ORAL_TABLET | Freq: Two times a day (BID) | ORAL | 3 refills | Status: DC

## 2023-10-10 NOTE — Patient Instructions (Signed)
Medication Instructions:  NO CHANGES  *If you need a refill on your cardiac medications before your next appointment, please call your pharmacy*   Follow-Up: At [facility], you and your health needs are our priority.  As part of our continuing mission to provide you with exceptional heart care, we have created designated Provider Care Teams.  These Care Teams include your primary Cardiologist (physician) and Advanced Practice Providers (APPs -  Physician Assistants and Nurse Practitioners) who all work together to provide you with the care you need, when you need it.  We recommend signing up for the patient portal called "MyChart".  Sign up information is provided on this After Visit Summary.  MyChart is used to connect with patients for Virtual Visits (Telemedicine).  Patients are able to view lab/test results, encounter notes, upcoming appointments, etc.  Non-urgent messages can be sent to your provider as well.   To learn more about what you can do with MyChart, go to [email or web address].    Your next appointment:   12 months with Dr. Flora Lipps or NP/PA  Please contact office if you need any testing for your DOT physical

## 2024-01-23 DIAGNOSIS — I10 Essential (primary) hypertension: Secondary | ICD-10-CM | POA: Diagnosis not present

## 2024-01-23 DIAGNOSIS — M1990 Unspecified osteoarthritis, unspecified site: Secondary | ICD-10-CM | POA: Diagnosis not present

## 2024-01-23 DIAGNOSIS — E669 Obesity, unspecified: Secondary | ICD-10-CM | POA: Diagnosis not present

## 2024-01-23 DIAGNOSIS — E1169 Type 2 diabetes mellitus with other specified complication: Secondary | ICD-10-CM | POA: Diagnosis not present

## 2024-01-23 DIAGNOSIS — E785 Hyperlipidemia, unspecified: Secondary | ICD-10-CM | POA: Diagnosis not present

## 2024-01-23 DIAGNOSIS — I25810 Atherosclerosis of coronary artery bypass graft(s) without angina pectoris: Secondary | ICD-10-CM | POA: Diagnosis not present

## 2024-01-23 DIAGNOSIS — Z6837 Body mass index (BMI) 37.0-37.9, adult: Secondary | ICD-10-CM | POA: Diagnosis not present

## 2024-01-23 DIAGNOSIS — G2581 Restless legs syndrome: Secondary | ICD-10-CM | POA: Diagnosis not present

## 2024-01-23 DIAGNOSIS — Z23 Encounter for immunization: Secondary | ICD-10-CM | POA: Diagnosis not present

## 2024-01-23 DIAGNOSIS — G43909 Migraine, unspecified, not intractable, without status migrainosus: Secondary | ICD-10-CM | POA: Diagnosis not present

## 2024-01-23 DIAGNOSIS — Z713 Dietary counseling and surveillance: Secondary | ICD-10-CM | POA: Diagnosis not present

## 2024-03-27 ENCOUNTER — Telehealth: Payer: Self-pay | Admitting: Cardiovascular Disease

## 2024-03-27 DIAGNOSIS — I2510 Atherosclerotic heart disease of native coronary artery without angina pectoris: Secondary | ICD-10-CM

## 2024-03-27 NOTE — Telephone Encounter (Signed)
 Patient is needing to complete a stress test for his yearly DOT health card. Patient is requesting for Dr. Rolm Clos to place an order for the stress test. Per patient messages "It will most like come out of my pocket. Ins. not that good. So cheaper the better. Not the most expensive one.lol. I do have a bad knee, so keep that in mine".

## 2024-04-05 ENCOUNTER — Other Ambulatory Visit: Payer: Self-pay | Admitting: Cardiovascular Disease

## 2024-04-05 DIAGNOSIS — R0602 Shortness of breath: Secondary | ICD-10-CM

## 2024-04-05 NOTE — Progress Notes (Signed)
 Consent ordered.   Melodee Spruce T. Rolm Clos, MD, [facility]  [facility]  [address], [address] [place], [place] [address] [phone]  1:25 PM

## 2024-04-10 ENCOUNTER — Encounter (HOSPITAL_COMMUNITY): Payer: Self-pay

## 2024-04-11 NOTE — Addendum Note (Signed)
 Addended by: Zeb Heys on: 04/11/2024 04:23 PM   Modules accepted: Orders

## 2024-04-12 ENCOUNTER — Other Ambulatory Visit: Payer: Self-pay | Admitting: Cardiovascular Disease

## 2024-04-12 DIAGNOSIS — R0602 Shortness of breath: Secondary | ICD-10-CM

## 2024-04-12 NOTE — Progress Notes (Signed)
 Attestation for stress test added.   Melodee Spruce T. Rolm Clos, MD, [facility]  [facility]  [address], [address] [place], [place] [address] [phone]  8:33 AM

## 2024-04-16 ENCOUNTER — Ambulatory Visit (HOSPITAL_COMMUNITY)

## 2024-04-18 ENCOUNTER — Telehealth (HOSPITAL_COMMUNITY): Payer: Self-pay | Admitting: *Deleted

## 2024-04-18 NOTE — Addendum Note (Signed)
 Addended by: Harrold Lincoln on: 04/18/2024 07:53 PM   Modules accepted: Orders

## 2024-04-18 NOTE — Telephone Encounter (Signed)
 Patient given detailed instructions per Myocardial Perfusion Study Information Sheet for the test on 04/23/2024 at 7:45. Patient notified to arrive 15 minutes early and that it is imperative to arrive on time for appointment to keep from having the test rescheduled.  If you need to cancel or reschedule your appointment, please call the office within 24 hours of your appointment. . Patient verbalized understanding.Anne Barrios

## 2024-04-19 ENCOUNTER — Other Ambulatory Visit: Payer: Self-pay | Admitting: Cardiovascular Disease

## 2024-04-19 DIAGNOSIS — I2510 Atherosclerotic heart disease of native coronary artery without angina pectoris: Secondary | ICD-10-CM

## 2024-04-23 ENCOUNTER — Ambulatory Visit (HOSPITAL_COMMUNITY): Attending: Cardiovascular Disease

## 2024-04-23 DIAGNOSIS — I2510 Atherosclerotic heart disease of native coronary artery without angina pectoris: Secondary | ICD-10-CM | POA: Diagnosis not present

## 2024-04-23 MED ORDER — REGADENOSON 0.4 MG/5ML IV SOLN
INTRAVENOUS | Status: AC
Start: 2024-04-23 — End: ?
  Filled 2024-04-23: qty 5

## 2024-04-23 MED ORDER — TECHNETIUM TC 99M TETROFOSMIN IV KIT
32.6000 | PACK | Freq: Once | INTRAVENOUS | Status: AC | PRN
  Administered 2024-04-23: 32.6 via INTRAVENOUS

## 2024-04-23 MED ORDER — REGADENOSON 0.4 MG/5ML IV SOLN
0.4000 mg | Freq: Once | INTRAVENOUS | Status: AC
  Administered 2024-04-23: 0.4 mg via INTRAVENOUS

## 2024-04-23 MED ORDER — TECHNETIUM TC 99M TETROFOSMIN IV KIT
10.3000 | PACK | Freq: Once | INTRAVENOUS | Status: AC | PRN
  Administered 2024-04-23: 10.3 via INTRAVENOUS

## 2024-04-24 ENCOUNTER — Ambulatory Visit: Payer: Self-pay | Admitting: Cardiovascular Disease

## 2024-04-24 DIAGNOSIS — R931 Abnormal findings on diagnostic imaging of heart and coronary circulation: Secondary | ICD-10-CM

## 2024-04-24 LAB — MYOCARDIAL PERFUSION IMAGING
LV dias vol: 131 mL (ref 62–150)
LV sys vol: 69 mL
Nuc Stress EF: 47 %
Peak HR: 86 {beats}/min
Rest HR: 69 {beats}/min
Rest Nuclear Isotope Dose: 10.3 mCi
SDS: 0
SRS: 2
SSS: 0
ST Depression (mm): 0 mm
Stress Nuclear Isotope Dose: 32.6 mCi
TID: 1.16

## 2024-06-19 ENCOUNTER — Ambulatory Visit (HOSPITAL_COMMUNITY)
Admission: RE | Admit: 2024-06-19 | Discharge: 2024-06-19 | Disposition: A | Discharge status: Home / Self Care | Source: Ambulatory Visit | Attending: Cardiology | Admitting: Cardiology

## 2024-06-19 DIAGNOSIS — R0600 Dyspnea, unspecified: Secondary | ICD-10-CM

## 2024-06-19 DIAGNOSIS — I2510 Atherosclerotic heart disease of native coronary artery without angina pectoris: Secondary | ICD-10-CM | POA: Diagnosis not present

## 2024-06-19 DIAGNOSIS — R931 Abnormal findings on diagnostic imaging of heart and coronary circulation: Secondary | ICD-10-CM | POA: Insufficient documentation

## 2024-06-19 LAB — ECHOCARDIOGRAM COMPLETE
Area-P 1/2: 3.81 cm2
S' Lateral: 3.5 cm

## 2024-06-20 ENCOUNTER — Ambulatory Visit: Payer: Self-pay | Admitting: Cardiovascular Disease

## 2024-07-23 DIAGNOSIS — I10 Essential (primary) hypertension: Secondary | ICD-10-CM | POA: Diagnosis not present

## 2024-07-29 DIAGNOSIS — Z1211 Encounter for screening for malignant neoplasm of colon: Secondary | ICD-10-CM | POA: Diagnosis not present

## 2024-08-28 ENCOUNTER — Encounter: Payer: Self-pay | Admitting: Internal Medicine

## 2024-10-01 ENCOUNTER — Other Ambulatory Visit: Payer: Self-pay | Admitting: Cardiovascular Disease

## 2024-10-30 ENCOUNTER — Other Ambulatory Visit: Payer: Self-pay | Admitting: Cardiovascular Disease

## 2024-11-22 ENCOUNTER — Telehealth: Payer: Self-pay

## 2024-11-22 NOTE — Telephone Encounter (Signed)
 RN called patient to schedule recall colonoscopy, no answer.  Left voicemail to call [facility] at his earliest convenience to get his colonoscopy scheduled.

## 2024-12-28 ENCOUNTER — Other Ambulatory Visit: Payer: Self-pay | Admitting: Cardiovascular Disease

## 2024-12-31 NOTE — Telephone Encounter (Signed)
 In accordance with refill protocols, please review and address the following requirements before this medication refill can be authorized:  Labs

## 2025-01-06 ENCOUNTER — Encounter: Payer: Self-pay | Admitting: Cardiovascular Disease

## 2025-01-17 ENCOUNTER — Other Ambulatory Visit: Payer: Self-pay | Admitting: Cardiovascular Disease

## 2025-01-17 ENCOUNTER — Encounter: Payer: Self-pay | Admitting: Cardiology

## 2025-01-17 ENCOUNTER — Ambulatory Visit: Admitting: Cardiology

## 2025-01-17 VITALS — BP 110/60 | HR 50 | Ht 70.0 in | Wt 266.2 lb

## 2025-01-17 DIAGNOSIS — I2510 Atherosclerotic heart disease of native coronary artery without angina pectoris: Secondary | ICD-10-CM

## 2025-01-17 DIAGNOSIS — I10 Essential (primary) hypertension: Secondary | ICD-10-CM

## 2025-01-17 DIAGNOSIS — E782 Mixed hyperlipidemia: Secondary | ICD-10-CM

## 2025-01-17 MED ORDER — METOPROLOL TARTRATE 25 MG PO TABS: 12.5000 mg | ORAL_TABLET | Freq: Two times a day (BID) | ORAL | 3 refills | Status: DC

## 2025-01-17 MED ORDER — EZETIMIBE 10 MG PO TABS: 10.0000 mg | ORAL_TABLET | Freq: Every day | ORAL | 3 refills | Status: AC

## 2025-01-17 MED ORDER — ROSUVASTATIN CALCIUM 5 MG PO TABS: 5.0000 mg | ORAL_TABLET | Freq: Every day | ORAL | 3 refills | Status: AC

## 2025-01-17 MED ORDER — NITROGLYCERIN 0.4 MG SL SUBL: 0.4000 mg | SUBLINGUAL_TABLET | SUBLINGUAL | 3 refills | Status: AC | PRN

## 2025-01-17 NOTE — Patient Instructions (Signed)
 Medication Instructions:  Your physician recommends that you continue on your current medications as directed. Please refer to the Current Medication list given to you today.  *If you need a refill on your cardiac medications before your next appointment, please call your pharmacy*  Lab Work: NONE  If you have labs (blood work) drawn today and your tests are completely normal, you will receive your results only by: MyChart Message (if you have MyChart) OR A paper copy in the mail If you have any lab test that is abnormal or we need to change your treatment, we will call you to review the results.  Testing/Procedures: NONE  Follow-Up: At [facility], you and your health needs are our priority.  As part of our continuing mission to provide you with exceptional heart care, our providers are all part of one team.  This team includes your primary Cardiologist (physician) and Advanced Practice Providers or APPs (Physician Assistants and Nurse Practitioners) who all work together to provide you with the care you need, when you need it.  Your next appointment:   1 year(s)  Provider:   Darryle ONEIDA Decent, MD
